# Patient Record
Sex: Female | Born: 1955 | Race: White | Hispanic: No | Smoking: Current every day smoker
Health system: Southern US, Community
[De-identification: ages and names within clinical notes are randomized; demographics above are authoritative.]

## PROBLEM LIST (undated history)

## (undated) DIAGNOSIS — K219 Gastro-esophageal reflux disease without esophagitis: Secondary | ICD-10-CM

## (undated) DIAGNOSIS — I10 Essential (primary) hypertension: Secondary | ICD-10-CM

## (undated) DIAGNOSIS — R112 Nausea with vomiting, unspecified: Secondary | ICD-10-CM

## (undated) DIAGNOSIS — Z9889 Other specified postprocedural states: Secondary | ICD-10-CM

## (undated) DIAGNOSIS — M1990 Unspecified osteoarthritis, unspecified site: Secondary | ICD-10-CM

## (undated) DIAGNOSIS — R51 Headache: Secondary | ICD-10-CM

## (undated) HISTORY — PX: CARPAL TUNNEL RELEASE: SHX101

## (undated) HISTORY — PX: TUBAL LIGATION: SHX77

## (undated) HISTORY — DX: Essential (primary) hypertension: I10

---

## 2000-12-06 ENCOUNTER — Ambulatory Visit (HOSPITAL_COMMUNITY): Admission: RE | Admit: 2000-12-06 | Discharge: 2000-12-06 | Payer: Self-pay | Admitting: Family Medicine

## 2000-12-06 ENCOUNTER — Encounter: Payer: Self-pay | Admitting: Family Medicine

## 2001-07-02 ENCOUNTER — Other Ambulatory Visit: Admission: RE | Admit: 2001-07-02 | Discharge: 2001-07-02 | Payer: Self-pay | Admitting: Family Medicine

## 2001-07-09 ENCOUNTER — Encounter: Payer: Self-pay | Admitting: Family Medicine

## 2001-07-09 ENCOUNTER — Ambulatory Visit (HOSPITAL_COMMUNITY): Admission: RE | Admit: 2001-07-09 | Discharge: 2001-07-09 | Payer: Self-pay | Admitting: Family Medicine

## 2002-10-22 ENCOUNTER — Other Ambulatory Visit: Admission: RE | Admit: 2002-10-22 | Discharge: 2002-10-22 | Payer: Self-pay | Admitting: *Deleted

## 2002-10-29 ENCOUNTER — Ambulatory Visit (HOSPITAL_COMMUNITY): Admission: RE | Admit: 2002-10-29 | Discharge: 2002-10-29 | Payer: Self-pay | Admitting: Internal Medicine

## 2002-10-29 ENCOUNTER — Encounter: Payer: Self-pay | Admitting: Internal Medicine

## 2005-05-31 ENCOUNTER — Ambulatory Visit (HOSPITAL_COMMUNITY): Admission: RE | Admit: 2005-05-31 | Discharge: 2005-05-31 | Payer: Self-pay | Admitting: Family Medicine

## 2006-02-27 IMAGING — CR DG CHEST 2V
2 series · 2 of 2 positions shown · non-contrast
Comparison: none

CLINICAL DATA: Smoking history.  Assess for active lung disease. 
 CHEST - 2 VIEW:

[view not recorded (1 of 2)]
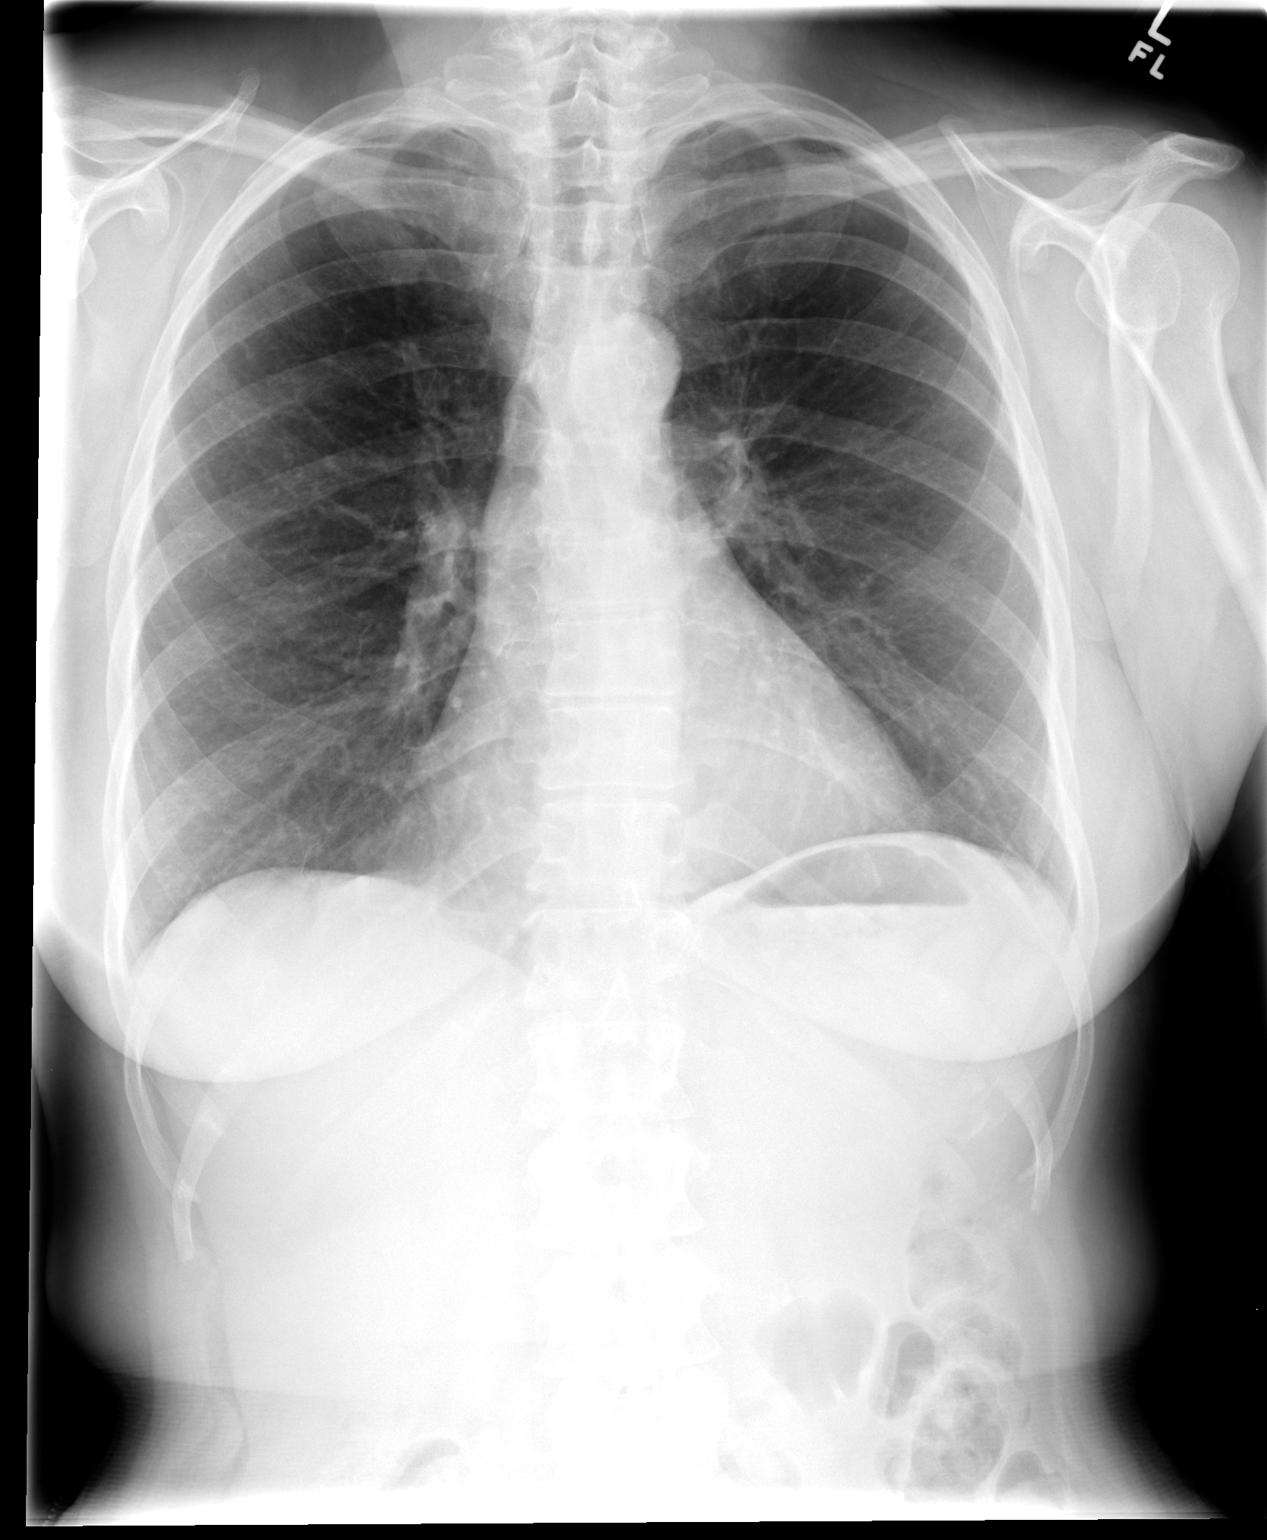

[view not recorded (2 of 2)]
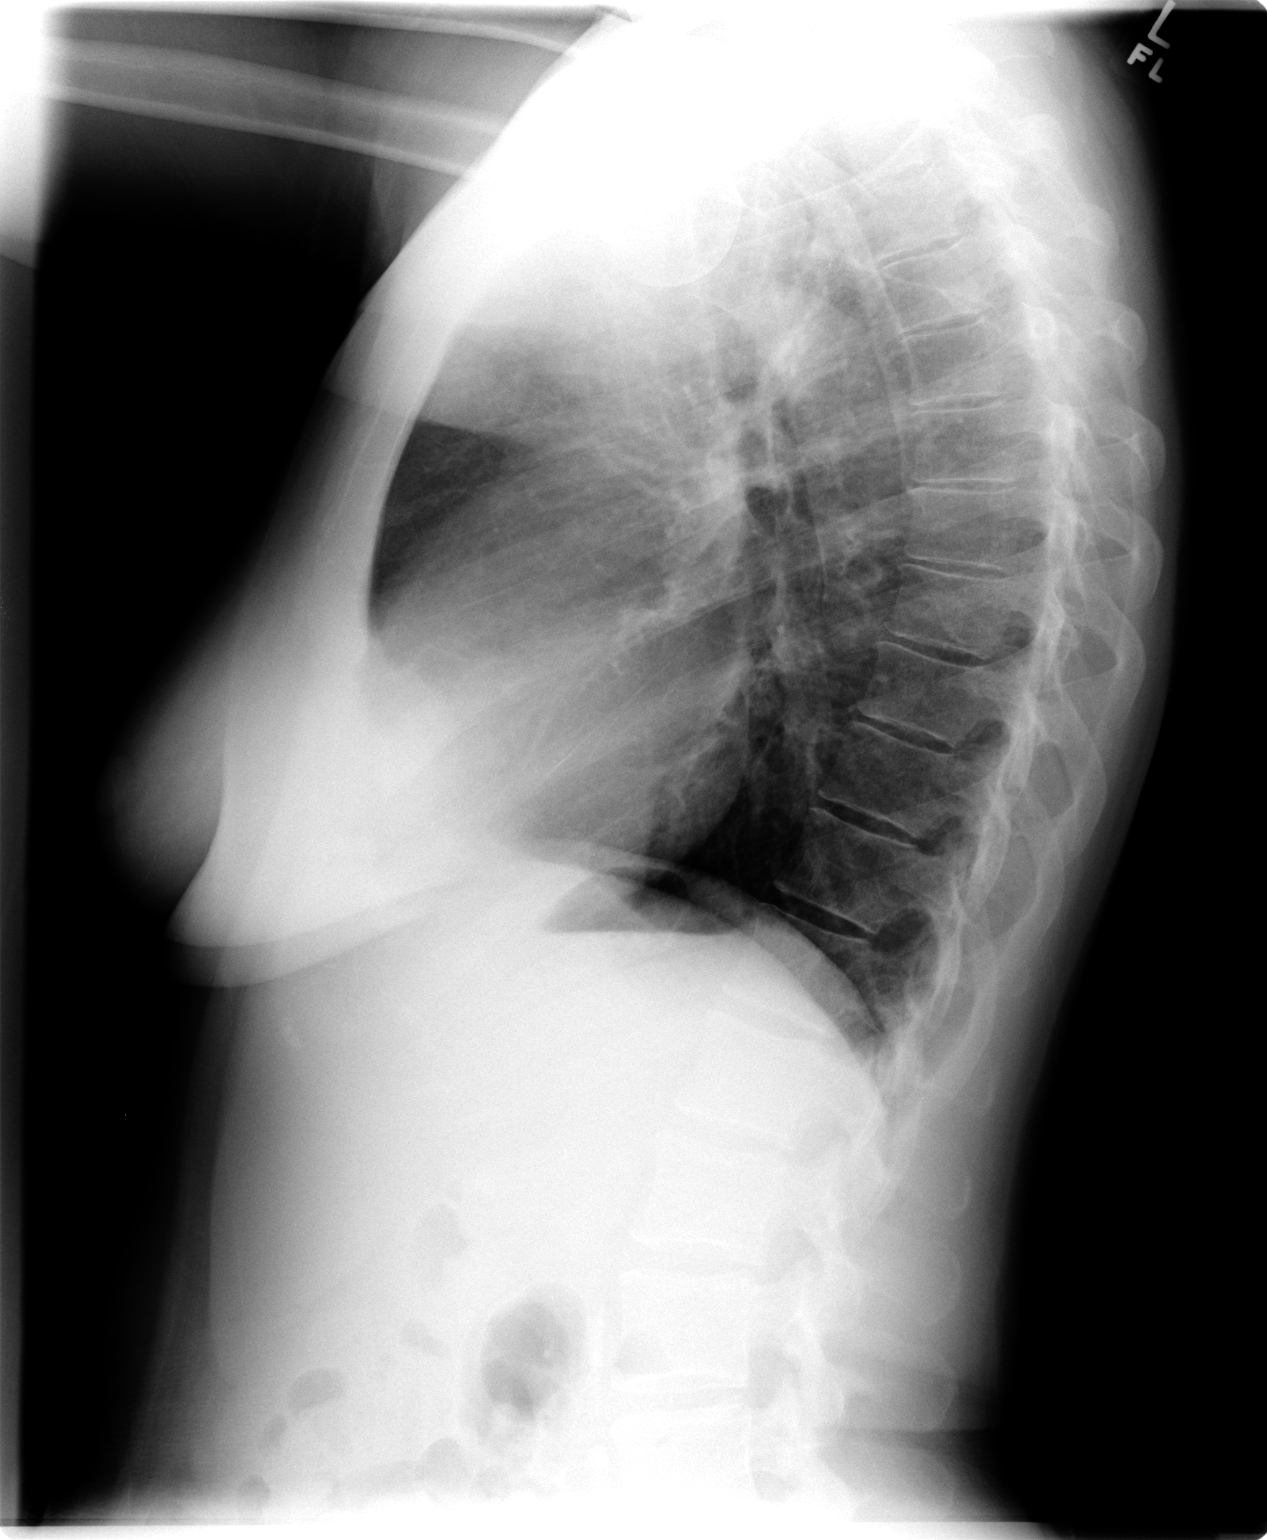

[2 of 2 positions shown; findings below may reference images not displayed]

FINDINGS: Heart size is normal.  Mediastinum is unremarkable except for mild scoliosis.  The lungs are clear.  No infiltrate, mass, effusion, or collapse.  No significant bony finding.
IMPRESSION: No active disease.

## 2008-09-21 ENCOUNTER — Emergency Department (HOSPITAL_COMMUNITY): Admission: EM | Admit: 2008-09-21 | Discharge: 2008-09-21 | Payer: Self-pay | Admitting: Emergency Medicine

## 2014-04-01 ENCOUNTER — Telehealth: Payer: Self-pay

## 2014-04-01 NOTE — Telephone Encounter (Signed)
Gastroenterology Pre-Procedure Review  Request Date: Requesting Physician: Laurance Flatten  PATIENT REVIEW QUESTIONS: The patient responded to the following health history questions as indicated:    1. Diabetes Melitis: NO 2. Joint replacements in the past 12 months: NO 3. Major health problems in the past 3 months: NO 4. Has an artificial valve or MVP: NO 5. Has a defibrillator: NO 6. Has been advised in past to take antibiotics in advance of a procedure like teeth cleaning: NO 7. Acholic:NO 8. Family History:NO     MEDICATIONS & ALLERGIES:    Patient reports the following regarding taking any blood thinners:   Plavix? NO Aspirin? NO Coumadin? NO  Patient confirms/reports the following medications:  No current outpatient prescriptions on file.   No current facility-administered medications for this visit.    Patient confirms/reports the following allergies:  Allergies not on file  No orders of the defined types were placed in this encounter.    AUTHORIZATION INFORMATION Primary Insurance: [place]  ,  [place] #: [id number] ,  Group #: [id number] Pre-Cert / Josem Kaufmann required:  Pre-Cert / Auth #:    SCHEDULE INFORMATION: Procedure has been scheduled as follows:  Date: , Time: Location:   This Gastroenterology Pre-Precedure Review Form is being routed to the following provider(s):   Would like RMR

## 2014-04-03 ENCOUNTER — Other Ambulatory Visit: Payer: Self-pay

## 2014-04-03 DIAGNOSIS — Z1211 Encounter for screening for malignant neoplasm of colon: Secondary | ICD-10-CM

## 2014-04-03 NOTE — Telephone Encounter (Signed)
PT is scheduled for colonoscopy with Dr. Gala Romney on 05/04/2014 at 7:30 Am

## 2014-04-06 NOTE — Telephone Encounter (Signed)
OK to schedule

## 2014-04-08 MED ORDER — PEG-KCL-NACL-NASULF-NA ASC-C 100 G PO SOLR: 1.0000 | ORAL | Status: DC

## 2014-04-08 NOTE — Telephone Encounter (Signed)
Rx sent to the pharmacy and instructions mailed to pt.  

## 2014-05-01 ENCOUNTER — Encounter (HOSPITAL_COMMUNITY): Payer: Self-pay | Admitting: Pharmacy Technician

## 2014-05-04 ENCOUNTER — Ambulatory Visit (HOSPITAL_COMMUNITY)
Admission: RE | Admit: 2014-05-04 | Discharge: 2014-05-04 | Disposition: A | Discharge status: Home / Self Care | Payer: BC Managed Care – PPO | Source: Ambulatory Visit | Attending: Internal Medicine | Admitting: Internal Medicine

## 2014-05-04 ENCOUNTER — Encounter (HOSPITAL_COMMUNITY)
Admission: RE | Disposition: A | Discharge status: Home / Self Care | Payer: Self-pay | Source: Ambulatory Visit | Attending: Internal Medicine

## 2014-05-04 ENCOUNTER — Encounter (HOSPITAL_COMMUNITY): Payer: Self-pay | Admitting: *Deleted

## 2014-05-04 DIAGNOSIS — K5730 Diverticulosis of large intestine without perforation or abscess without bleeding: Secondary | ICD-10-CM | POA: Diagnosis not present

## 2014-05-04 DIAGNOSIS — K648 Other hemorrhoids: Secondary | ICD-10-CM | POA: Diagnosis not present

## 2014-05-04 DIAGNOSIS — Z1211 Encounter for screening for malignant neoplasm of colon: Secondary | ICD-10-CM | POA: Diagnosis present

## 2014-05-04 DIAGNOSIS — Z86010 Personal history of colonic polyps: Secondary | ICD-10-CM

## 2014-05-04 DIAGNOSIS — D126 Benign neoplasm of colon, unspecified: Secondary | ICD-10-CM | POA: Diagnosis not present

## 2014-05-04 HISTORY — DX: Unspecified osteoarthritis, unspecified site: M19.90

## 2014-05-04 HISTORY — DX: Gastro-esophageal reflux disease without esophagitis: K21.9

## 2014-05-04 HISTORY — DX: Headache: R51

## 2014-05-04 HISTORY — PX: COLONOSCOPY: SHX5424

## 2014-05-04 SURGERY — COLONOSCOPY
Anesthesia: Moderate Sedation

## 2014-05-04 MED ORDER — ONDANSETRON HCL 4 MG/2ML IJ SOLN
INTRAMUSCULAR | Status: DC | PRN
  Administered 2014-05-04: 4 mg via INTRAVENOUS

## 2014-05-04 MED ORDER — MIDAZOLAM HCL 5 MG/5ML IJ SOLN
INTRAMUSCULAR | Status: DC | PRN
Start: 2014-05-04 — End: 2014-05-04
  Administered 2014-05-04: 1 mg via INTRAVENOUS
  Administered 2014-05-04: 2 mg via INTRAVENOUS
  Administered 2014-05-04 (×2): 1 mg via INTRAVENOUS

## 2014-05-04 MED ORDER — MEPERIDINE HCL 100 MG/ML IJ SOLN
INTRAMUSCULAR | Status: DC | PRN
  Administered 2014-05-04 (×2): 25 mg via INTRAVENOUS

## 2014-05-04 MED ORDER — SODIUM CHLORIDE 0.9 % IV SOLN
INTRAVENOUS | Status: DC
  Administered 2014-05-04: 07:00:00 via INTRAVENOUS

## 2014-05-04 MED ORDER — SIMETHICONE 40 MG/0.6ML PO SUSP
ORAL | Status: DC | PRN
  Administered 2014-05-04: 08:00:00

## 2014-05-04 MED ORDER — ONDANSETRON HCL 4 MG/2ML IJ SOLN
INTRAMUSCULAR | Status: AC
  Filled 2014-05-04: qty 2

## 2014-05-04 MED ORDER — MIDAZOLAM HCL 5 MG/5ML IJ SOLN
INTRAMUSCULAR | Status: AC
  Filled 2014-05-04: qty 10

## 2014-05-04 MED ORDER — SIMETHICONE 40 MG/0.6ML PO SUSP
ORAL | Status: AC
  Filled 2014-05-04: qty 0.6

## 2014-05-04 MED ORDER — MEPERIDINE HCL 100 MG/ML IJ SOLN
INTRAMUSCULAR | Status: AC
  Filled 2014-05-04: qty 2

## 2014-05-04 NOTE — H&P (Signed)
@LOGO @   Primary Care Physician:  Everrett Coombe, NP Primary Gastroenterologist:  Dr. Gala Romney  Pre-Procedure History & Physical: HPI:  Joan Collins is a 58 y.o. female is here for a screening colonoscopy. No bowel symptoms. No family history of colon cancer. No prior colonoscopy.  Past Medical History  Diagnosis Date  . Arthritis   . Headache(784.0)     Migraines  . GERD (gastroesophageal reflux disease)     History reviewed. No pertinent past surgical history.  Prior to Admission medications   Medication Sig Start Date End Date Taking? Authorizing Provider  ALOE VERA PO Take 1 tablet by mouth 2 (two) times daily.   Yes Historical Provider, MD  aspirin-acetaminophen-caffeine (EXCEDRIN MIGRAINE) [phone] MG per tablet Take 1 tablet by mouth every 6 (six) hours as needed for headache.   Yes Historical Provider, MD  calcium-vitamin D (OSCAL WITH D) 500-200 MG-UNIT per tablet Take 1 tablet by mouth 2 (two) times daily.   Yes Historical Provider, MD  omega-3 acid ethyl esters (LOVAZA) 1 G capsule Take 1 g by mouth 2 (two) times daily.   Yes Historical Provider, MD  polycarbophil (FIBERCON) 625 MG tablet Take 625 mg by mouth 2 (two) times daily.   Yes Historical Provider, MD  TURMERIC PO Take 1 tablet by mouth 2 (two) times daily.   Yes Historical Provider, MD    Allergies as of 04/03/2014  . (No Known Allergies)    Family History  Problem Relation Age of Onset  . Colon cancer Neg Hx     History   Social History  . Marital Status: Married    Spouse Name: N/A    Number of Children: N/A  . Years of Education: N/A   Occupational History  . Not on file.   Social History Main Topics  . Smoking status: Current Every Day Smoker -- 0.50 packs/day for 20 years    Types: Cigarettes  . Smokeless tobacco: Not on file  . Alcohol Use: No  . Drug Use: No  . Sexual Activity: Not on file   Other Topics Concern  . Not on file   Social History Narrative  . No  narrative on file    Review of Systems: See HPI, otherwise negative ROS  Physical Exam: BP 173/87  Pulse 69  Temp(Src) 97.8 F (36.6 C) (Oral)  Resp 26  Ht 5\' 1"  (1.549 m)  Wt 110 lb (49.896 kg)  BMI 20.80 kg/m2  SpO2 97% General:   Alert,  Well-developed, well-nourished, pleasant and cooperative in NAD Head:  Normocephalic and atraumatic. Eyes:  Sclera clear, no icterus.   Conjunctiva pink. Ears:  Normal auditory acuity. Nose:  No deformity, discharge,  or lesions. Mouth:  No deformity or lesions, dentition normal. Neck:  Supple; no masses or thyromegaly. Lungs:  Clear throughout to auscultation.   No wheezes, crackles, or rhonchi. No acute distress. Heart:  Regular rate and rhythm; no murmurs, clicks, rubs,  or gallops. Abdomen:  Soft, nontender and nondistended. No masses, hepatosplenomegaly or hernias noted. Normal bowel sounds, without guarding, and without rebound.   Msk:  Symmetrical without gross deformities. Normal posture. Pulses:  Normal pulses noted. Extremities:  Without clubbing or edema. Neurologic:  Alert and  oriented x4;  grossly normal neurologically.  Impression/Plan: [name] is now here to undergo a screening colonoscopy.  First-ever average risk screening examination. Risks, benefits, limitations, imponderables and alternatives regarding colonoscopy have been reviewed with the patient. Questions have been answered. All parties agreeable.  Notice:  This dictation was prepared with Dragon dictation along with smaller phrase technology. Any transcriptional errors that result from this process are unintentional and may not be corrected upon review.

## 2014-05-04 NOTE — Discharge Instructions (Addendum)
Colonoscopy Discharge Instructions  Read the instructions outlined below and refer to this sheet in the next few weeks. These discharge instructions provide you with general information on caring for yourself after you leave the hospital. Your doctor may also give you specific instructions. While your treatment has been planned according to the most current medical practices available, unavoidable complications occasionally occur. If you have any problems or questions after discharge, call Dr. Gala Romney at [phone]. ACTIVITY  You may resume your regular activity, but move at a slower pace for the next 24 hours.   Take frequent rest periods for the next 24 hours.   Walking will help get rid of the air and reduce the bloated feeling in your belly (abdomen).   No driving for 24 hours (because of the medicine (anesthesia) used during the test).    Do not sign any important legal documents or operate any machinery for 24 hours (because of the anesthesia used during the test).  NUTRITION  Drink plenty of fluids.   You may resume your normal diet as instructed by your doctor.   Begin with a light meal and progress to your normal diet. Heavy or fried foods are harder to digest and may make you feel sick to your stomach (nauseated).   Avoid alcoholic beverages for 24 hours or as instructed.  MEDICATIONS  You may resume your normal medications unless your doctor tells you otherwise.  WHAT YOU CAN EXPECT TODAY  Some feelings of bloating in the abdomen.   Passage of more gas than usual.   Spotting of blood in your stool or on the toilet paper.  IF YOU HAD POLYPS REMOVED DURING THE COLONOSCOPY:  No aspirin products for 7 days or as instructed.   No alcohol for 7 days or as instructed.   Eat a soft diet for the next 24 hours.  FINDING OUT THE RESULTS OF YOUR TEST Not all test results are available during your visit. If your test results are not back during the visit, make an appointment  with your caregiver to find out the results. Do not assume everything is normal if you have not heard from your caregiver or the medical facility. It is important for you to follow up on all of your test results.  SEEK IMMEDIATE MEDICAL ATTENTION IF:  You have more than a spotting of blood in your stool.   Your belly is swollen (abdominal distention).   You are nauseated or vomiting.   You have a temperature over 101.   You have abdominal pain or discomfort that is severe or gets worse throughout the day.     Polyp and diverticulosis information provided  Further recommendations to follow pending review of pathology   Diverticulosis Diverticulosis is the condition that develops when small pouches (diverticula) form in the wall of your colon. Your colon, or large intestine, is where water is absorbed and stool is formed. The pouches form when the inside layer of your colon pushes through weak spots in the outer layers of your colon. CAUSES  No one knows exactly what causes diverticulosis. RISK FACTORS  Being older than 36. Your risk for this condition increases with age. Diverticulosis is rare in people younger than 40 years. By age 62, almost everyone has it.  Eating a low-fiber diet.  Being frequently constipated.  Being overweight.  Not getting enough exercise.  Smoking.  Taking over-the-counter pain medicines, like aspirin and ibuprofen. SYMPTOMS  Most people with diverticulosis do not have symptoms. DIAGNOSIS  diverticulosis often has no symptoms, health care providers often discover the condition during an exam for other colon problems. In many cases, a health care provider will diagnose diverticulosis while using a flexible scope to examine the colon (colonoscopy). °TREATMENT  °If you have never developed an infection related to diverticulosis, you may not need treatment. If you have had an infection before, treatment may include: °· Eating more fruits,  vegetables, and grains. °· Taking a fiber supplement. °· Taking a live bacteria supplement (probiotic). °· Taking medicine to relax your colon. °HOME CARE INSTRUCTIONS  °· Drink at least 6-8 glasses of water each day to prevent constipation. °· Try not to strain when you have a bowel movement. °· Keep all follow-up appointments. °If you have had an infection before:  °· Increase the fiber in your diet as directed by your health care provider or dietitian. °· Take a dietary fiber supplement if your health care provider approves. °· Only take medicines as directed by your health care provider. °SEEK MEDICAL CARE IF:  °· You have abdominal pain. °· You have bloating. °· You have cramps. °· You have not gone to the bathroom in 3 days. °SEEK IMMEDIATE MEDICAL CARE IF:  °· Your pain gets worse. °· Your bloating becomes very bad. °· You have a fever or chills, and your symptoms suddenly get worse. °· You begin vomiting. °· You have bowel movements that are bloody or black. °MAKE SURE YOU: °· Understand these instructions. °· Will watch your condition. °· Will get help right away if you are not doing well or get worse. °Document Released: 05/04/2004 Document Revised: 08/12/2013 Document Reviewed: 07/02/2013 °ExitCare® Patient Information ©2015 [employer]. This information is not intended to replace advice given to you by your health care provider. Make sure you discuss any questions you have with your health care provider. ° ° °Colon Polyps °Polyps are lumps of extra tissue growing inside the body. Polyps can grow in the large intestine (colon). Most colon polyps are noncancerous (benign). However, some colon polyps can become cancerous over time. Polyps that are larger than a pea may be harmful. To be safe, caregivers remove and test all polyps. °CAUSES  °Polyps form when mutations in the genes cause your cells to grow and divide even though no more tissue is needed. °RISK FACTORS °There are a number of risk factors  that can increase your chances of getting colon polyps. They include: °· Being older than 50 years. °· Family history of colon polyps or colon cancer. °· Long-term colon diseases, such as colitis or Crohn disease. °· Being overweight. °· Smoking. °· Being inactive. °· Drinking too much alcohol. °SYMPTOMS  °Most small polyps do not cause symptoms. If symptoms are present, they may include: °· Blood in the stool. The stool may look dark red or black. °· Constipation or diarrhea that lasts longer than 1 week. °DIAGNOSIS °People often do not know they have polyps until their caregiver finds them during a regular checkup. Your caregiver can use 4 tests to check for polyps: °· Digital rectal exam. The caregiver wears gloves and feels inside the rectum. This test would find polyps only in the rectum. °· Barium enema. The caregiver puts a liquid called barium into your rectum before taking X-rays of your colon. Barium makes your colon look white. Polyps are dark, so they are easy to see in the X-ray pictures. °· Sigmoidoscopy. A thin, flexible tube (sigmoidoscope) is placed into your rectum. The sigmoidoscope has a light and tiny camera   in it. The caregiver uses the sigmoidoscope to look at the last third of your colon. °· Colonoscopy. This test is like sigmoidoscopy, but the caregiver looks at the entire colon. This is the most common method for finding and removing polyps. °TREATMENT  °Any polyps will be removed during a sigmoidoscopy or colonoscopy. The polyps are then tested for cancer. °PREVENTION  °To help lower your risk of getting more colon polyps: °· Eat plenty of fruits and vegetables. Avoid eating fatty foods. °· Do not smoke. °· Avoid drinking alcohol. °· Exercise every day. °· Lose weight if recommended by your caregiver. °· Eat plenty of calcium and folate. Foods that are rich in calcium include milk, cheese, and broccoli. Foods that are rich in folate include chickpeas, kidney beans, and spinach. °HOME CARE  INSTRUCTIONS °Keep all follow-up appointments as directed by your caregiver. You may need periodic exams to check for polyps. °SEEK MEDICAL CARE IF: °You notice bleeding during a bowel movement. °Document Released: 05/03/2004 Document Revised: 10/30/2011 Document Reviewed: 10/17/2011 °ExitCare® Patient Information ©2015 [employer]. This information is not intended to replace advice given to you by your health care provider. Make sure you discuss any questions you have with your health care provider. ° °

## 2014-05-04 NOTE — Op Note (Signed)
[facility] [address] [place], [address]   COLONOSCOPY PROCEDURE REPORT  PATIENT: Joan Collins, Joan Collins  MR#:         [id number] BIRTHDATE: 1956/06/04 , 57  yrs. old GENDER: Female ENDOSCOPIST: R.  Garfield Cornea, MD FACP [facility] REFERRED BY:  Redge Gainer, M.D. PROCEDURE DATE:  05/04/2014 PROCEDURE:     Colonoscopy with biopsy  INDICATIONS: First-ever average risk colorectal cancer screening examination  INFORMED CONSENT:  The risks, benefits, alternatives and imponderables including but not limited to bleeding, perforation as well as the possibility of a missed lesion have been reviewed.  The potential for biopsy, lesion removal, etc. have also been discussed.  Questions have been answered.  All parties agreeable. Please see the history and physical in the medical record for more information.  MEDICATIONS: Versed 5 mg IV and Demerol 50 mg IV in divided doses. Zofran 4 mg IV.  DESCRIPTION OF PROCEDURE:  After a digital rectal exam was performed, the EC-3890Li ([id number]) and EC-3490TLi ([id number]) colonoscope was advanced from the anus through the rectum and colon to the area of the cecum, ileocecal valve and appendiceal orifice. The cecum was deeply intubated.  These structures were well-seen and photographed for the record.  From the level of the cecum and ileocecal valve, the scope was slowly and cautiously withdrawn. The mucosal surfaces were carefully surveyed utilizing scope tip deflection to facilitate fold flattening as needed.  The scope was pulled down into the rectum where a thorough examination including retroflexion was performed.    FINDINGS:  Adequate preparation. Anal papilla and internal hemorrhoids; otherwise, normal rectum. Scattered pancolonic diverticula; (1) 3 mm polyp at the splenic flexure; otherwise, the remainder of colonic mucosa appeared normal.  THERAPEUTIC / DIAGNOSTIC MANEUVERS PERFORMED:  The above-mentioned polyps cold  biopsied/removed.  COMPLICATIONS: none  CECAL WITHDRAWAL TIME:  14 minutes  IMPRESSION:  Pancolonic diverticulosis. Single colonic polyp-removed as described above.  RECOMMENDATIONS: Followup on pathology.   _______________________________ eSigned:  R. Garfield Cornea, MD FACP [facility] 05/04/2014 8:32 AM   CC:

## 2014-05-06 ENCOUNTER — Encounter (HOSPITAL_COMMUNITY): Payer: Self-pay | Admitting: Internal Medicine

## 2014-05-06 ENCOUNTER — Encounter: Payer: Self-pay | Admitting: Internal Medicine

## 2014-08-15 ENCOUNTER — Encounter (HOSPITAL_COMMUNITY): Payer: Self-pay | Admitting: Emergency Medicine

## 2014-08-15 ENCOUNTER — Emergency Department (HOSPITAL_COMMUNITY): Payer: BC Managed Care – PPO

## 2014-08-15 ENCOUNTER — Emergency Department (HOSPITAL_COMMUNITY)
Admission: EM | Admit: 2014-08-15 | Discharge: 2014-08-15 | Disposition: A | Discharge status: Home / Self Care | Payer: BC Managed Care – PPO | Attending: Emergency Medicine | Admitting: Emergency Medicine

## 2014-08-15 DIAGNOSIS — R51 Headache: Secondary | ICD-10-CM

## 2014-08-15 DIAGNOSIS — Z8739 Personal history of other diseases of the musculoskeletal system and connective tissue: Secondary | ICD-10-CM | POA: Insufficient documentation

## 2014-08-15 DIAGNOSIS — I10 Essential (primary) hypertension: Secondary | ICD-10-CM | POA: Diagnosis not present

## 2014-08-15 DIAGNOSIS — Z8719 Personal history of other diseases of the digestive system: Secondary | ICD-10-CM | POA: Insufficient documentation

## 2014-08-15 DIAGNOSIS — G43909 Migraine, unspecified, not intractable, without status migrainosus: Secondary | ICD-10-CM | POA: Diagnosis not present

## 2014-08-15 DIAGNOSIS — Z79899 Other long term (current) drug therapy: Secondary | ICD-10-CM | POA: Diagnosis not present

## 2014-08-15 DIAGNOSIS — R519 Headache, unspecified: Secondary | ICD-10-CM

## 2014-08-15 DIAGNOSIS — Z720 Tobacco use: Secondary | ICD-10-CM | POA: Diagnosis not present

## 2014-08-15 LAB — CBC WITH DIFFERENTIAL/PLATELET
BASOS ABS: 0 10*3/uL (ref 0.0–0.1)
Basophils Relative: 0 % (ref 0–1)
Eosinophils Absolute: 0 10*3/uL (ref 0.0–0.7)
Eosinophils Relative: 0 % (ref 0–5)
HCT: 42.1 % (ref 36.0–46.0)
HEMOGLOBIN: 13.7 g/dL (ref 12.0–15.0)
LYMPHS PCT: 15 % (ref 12–46)
Lymphs Abs: 0.9 10*3/uL (ref 0.7–4.0)
MCH: 29.5 pg (ref 26.0–34.0)
MCHC: 32.5 g/dL (ref 30.0–36.0)
MCV: 90.5 fL (ref 78.0–100.0)
MONO ABS: 0.3 10*3/uL (ref 0.1–1.0)
Monocytes Relative: 5 % (ref 3–12)
NEUTROS ABS: 4.9 10*3/uL (ref 1.7–7.7)
Neutrophils Relative %: 80 % — ABNORMAL HIGH (ref 43–77)
Platelets: 228 10*3/uL (ref 150–400)
RBC: 4.65 MIL/uL (ref 3.87–5.11)
RDW: 13.3 % (ref 11.5–15.5)
WBC: 6.1 10*3/uL (ref 4.0–10.5)

## 2014-08-15 LAB — SEDIMENTATION RATE: Sed Rate: 3 mm/hr (ref 0–22)

## 2014-08-15 LAB — COMPREHENSIVE METABOLIC PANEL
ALT: 12 U/L (ref 0–35)
AST: 15 U/L (ref 0–37)
Albumin: 4.2 g/dL (ref 3.5–5.2)
Alkaline Phosphatase: 59 U/L (ref 39–117)
Anion gap: 6 (ref 5–15)
BILIRUBIN TOTAL: 0.5 mg/dL (ref 0.3–1.2)
BUN: 10 mg/dL (ref 6–23)
CHLORIDE: 104 meq/L (ref 96–112)
CO2: 29 mmol/L (ref 19–32)
CREATININE: 0.6 mg/dL (ref 0.50–1.10)
Calcium: 9.4 mg/dL (ref 8.4–10.5)
GFR calc Af Amer: >90 mL/min (ref >90)
GLUCOSE: 106 mg/dL — AB (ref 70–99)
Potassium: 4 mmol/L (ref 3.5–5.1)
Sodium: 139 mmol/L (ref 135–145)
Total Protein: 6.9 g/dL (ref 6.0–8.3)

## 2014-08-15 MED ORDER — HYDROCHLOROTHIAZIDE 25 MG PO TABS: 25.0000 mg | ORAL_TABLET | Freq: Every day | ORAL | Status: DC

## 2014-08-15 MED ORDER — DIPHENHYDRAMINE HCL 50 MG/ML IJ SOLN
25.0000 mg | Freq: Once | INTRAMUSCULAR | Status: AC
  Administered 2014-08-15: 25 mg via INTRAVENOUS
  Filled 2014-08-15: qty 1

## 2014-08-15 MED ORDER — METOCLOPRAMIDE HCL 5 MG/ML IJ SOLN
10.0000 mg | Freq: Once | INTRAMUSCULAR | Status: AC
  Administered 2014-08-15: 10 mg via INTRAVENOUS
  Filled 2014-08-15: qty 2

## 2014-08-15 MED ORDER — KETOROLAC TROMETHAMINE 30 MG/ML IJ SOLN
30.0000 mg | Freq: Once | INTRAMUSCULAR | Status: AC
  Administered 2014-08-15: 30 mg via INTRAVENOUS
  Filled 2014-08-15: qty 1

## 2014-08-15 NOTE — ED Notes (Signed)
Patient complaining of headache and high blood pressure x 3 days. Also complaining of vomiting that started this morning.

## 2014-08-15 NOTE — ED Provider Notes (Signed)
CSN: [id number]     Arrival date & time 08/15/14  1220 History  This chart was scribed for Ezequiel Essex, MD by Stephania Fragmin, ED Scribe. This patient was seen in room [facility]08/[facility]08 and the patient's care was started at 3:59 PM.    Chief Complaint  Patient presents with  . Headache  . Hypertension   The history is provided by the patient. No language interpreter was used.   HPI Comments: Joan Collins is a 58 y.o. female who presents to the Emergency Department complaining of a gradual onset, constant, worsening, generalized headache that began 3 days ago. Patient complains of associated vomiting, blurry vision, numbness in her hands, and weakness in her legs. Coffee, Sprite, light, and noise all exacerbate her pain, although she states that this does not feel like a migraine. Patient has tried Excedrin Migraine with no relief. She has been off BP medication for about 2 months. She complains of associated hypertension; her BP last night was 173/110. Patient has a history GERD and arthritis. She denies taking prescription medications. Patient denies regular use of aspirin or blood thinners. She denies fever. Patient has NKDA. Everrett Coombe, NP is her PCP.   Past Medical History  Diagnosis Date  . Arthritis   . Headache(784.0)     Migraines  . GERD (gastroesophageal reflux disease)    Past Surgical History  Procedure Laterality Date  . Colonoscopy N/A 05/04/2014    Procedure: COLONOSCOPY;  Surgeon: Daneil Dolin, MD;  Location: [facility];  Service: Endoscopy;  Laterality: N/A;  7:30 AM  . Tubal ligation     Family History  Problem Relation Age of Onset  . Colon cancer Neg Hx    History  Substance Use Topics  . Smoking status: Current Every Day Smoker -- 0.50 packs/day for 20 years    Types: Cigarettes  . Smokeless tobacco: Not on file  . Alcohol Use: No   OB History    No data available     Review of Systems  A complete 10 system review of systems was obtained and  all systems are negative except as noted in the HPI and PMH.    Allergies  Review of patient's allergies indicates no known allergies.  Home Medications   Prior to Admission medications   Medication Sig Start Date End Date Taking? Authorizing Provider  ALOE VERA PO Take 1 tablet by mouth 2 (two) times daily.   Yes Historical Provider, MD  aspirin-acetaminophen-caffeine (EXCEDRIN MIGRAINE) [phone] MG per tablet Take 1 tablet by mouth every 6 (six) hours as needed for headache.   Yes Historical Provider, MD  calcium-vitamin D (OSCAL WITH D) 500-200 MG-UNIT per tablet Take 1 tablet by mouth 2 (two) times daily.   Yes Historical Provider, MD  polycarbophil (FIBERCON) 625 MG tablet Take 625 mg by mouth 2 (two) times daily.   Yes Historical Provider, MD  TURMERIC PO Take 1 tablet by mouth 2 (two) times daily.   Yes Historical Provider, MD  hydrochlorothiazide (HYDRODIURIL) 25 MG tablet Take 1 tablet (25 mg total) by mouth daily. 08/15/14   Ezequiel Essex, MD  omega-3 acid ethyl esters (LOVAZA) 1 G capsule Take 1 g by mouth 2 (two) times daily.    Historical Provider, MD   BP 165/71 mmHg  Pulse 72  Temp(Src) 98 F (36.7 C) (Oral)  Resp 19  Ht 5\' 1"  (1.549 m)  Wt 113 lb (51.256 kg)  BMI 21.36 kg/m2  SpO2 95% Physical Exam  Constitutional:  She is oriented to person, place, and time. She appears well-developed and well-nourished. No distress.  HENT:  Head: Normocephalic and atraumatic.  Mouth/Throat: Oropharynx is clear and moist. No oropharyngeal exudate.  Frontal sinus tenderness. No temporal artery tenderness.  Eyes: Conjunctivae and EOM are normal. Pupils are equal, round, and reactive to light.  Neck: Normal range of motion. Neck supple.  No meningismus.  Cardiovascular: Normal rate, regular rhythm, normal heart sounds and intact distal pulses.   No murmur heard. Pulmonary/Chest: Effort normal and breath sounds normal. No respiratory distress.  Abdominal: Soft. There is no  tenderness. There is no rebound and no guarding.  Musculoskeletal: Normal range of motion. She exhibits no edema or tenderness.  Neurological: She is alert and oriented to person, place, and time. No cranial nerve deficit. She exhibits normal muscle tone. Coordination normal.  No ataxia on finger to nose bilaterally. No pronator drift. 5/5 strength throughout. CN 2-12 intact. Negative Romberg. Equal grip strength. Sensation intact. Gait is normal.   Skin: Skin is warm.  Psychiatric: She has a normal mood and affect. Her behavior is normal.  Nursing note and vitals reviewed.   ED Course  Procedures (including critical care time)  DIAGNOSTIC STUDIES: Oxygen Saturation is 95% on room air, adequate by my interpretation.    COORDINATION OF CARE: 4:08 PM - Discussed treatment plan with pt at bedside which includes pain medication and tests and pt agreed to plan.  6:51 PM - Patient states that the headache has almost dissipated. Discussed plans to discharge.  Labs Review Labs Reviewed  CBC WITH DIFFERENTIAL - Abnormal; Notable for the following:    Neutrophils Relative % 80 (*)    All other components within normal limits  COMPREHENSIVE METABOLIC PANEL - Abnormal; Notable for the following:    Glucose, Bld 106 (*)    All other components within normal limits  SEDIMENTATION RATE    Imaging Review Ct Head Wo Contrast  08/15/2014   CLINICAL DATA:  Headache for weeks.  EXAM: CT HEAD WITHOUT CONTRAST  TECHNIQUE: Contiguous axial images were obtained from the base of the skull through the vertex without intravenous contrast.  COMPARISON:  None.  FINDINGS: No acute intracranial abnormality. Specifically, no hemorrhage, hydrocephalus, mass lesion, acute infarction, or significant intracranial injury. No acute calvarial abnormality. Visualized paranasal sinuses and mastoids clear. Orbital soft tissues unremarkable.  IMPRESSION: Negative.   Electronically Signed   By: Rolm Baptise M.D.   On:  08/15/2014 17:39     EKG Interpretation None      MDM   Final diagnoses:  Headache  Essential hypertension   history of gradual onset diffuse headache. No focal weakness, numbness or tingling. 2 episodes of vomiting. No longer takes medicine for high blood pressure.  No thunderclap onset. Low suspicion for SAH, meningitis, temporal arteritis.  Nonfocal neurological exam. CT head negative. Blood pressure 155/79  Headache resolved with toradol, reglan, benadryl in the ED.  Advised patient to keep BP log. WIll start low dose HCTZ.  Follow up with PCP. Return precautions discussed.  BP 165/71 mmHg  Pulse 72  Temp(Src) 98 F (36.7 C) (Oral)  Resp 19  Ht 5\' 1"  (1.549 m)  Wt 113 lb (51.256 kg)  BMI 21.36 kg/m2  SpO2 95%   I personally performed the services described in this documentation, which was scribed in my presence. The recorded information has been reviewed and is accurate.    Ezequiel Essex, MD 08/16/14 [phone]

## 2014-08-15 NOTE — Discharge Instructions (Signed)
Hypertension Follow-up with her doctor regarding her elevated blood pressure. Take the medication as prescribed. Return to the ED if you develop new or worsening symptoms. Hypertension, commonly called high blood pressure, is when the force of blood pumping through your arteries is too strong. Your arteries are the blood vessels that carry blood from your heart throughout your body. A blood pressure reading consists of a higher number over a lower number, such as 110/72. The higher number (systolic) is the pressure inside your arteries when your heart pumps. The lower number (diastolic) is the pressure inside your arteries when your heart relaxes. Ideally you want your blood pressure below 120/80. Hypertension forces your heart to work harder to pump blood. Your arteries may become narrow or stiff. Having hypertension puts you at risk for heart disease, stroke, and other problems.  RISK FACTORS Some risk factors for high blood pressure are controllable. Others are not.  Risk factors you cannot control include:   Race. You may be at higher risk if you are African American.  Age. Risk increases with age.  Gender. Men are at higher risk than women before age 71 years. After age 77, women are at higher risk than men. Risk factors you can control include:  Not getting enough exercise or physical activity.  Being overweight.  Getting too much fat, sugar, calories, or salt in your diet.  Drinking too much alcohol. SIGNS AND SYMPTOMS Hypertension does not usually cause signs or symptoms. Extremely high blood pressure (hypertensive crisis) may cause headache, anxiety, shortness of breath, and nosebleed. DIAGNOSIS  To check if you have hypertension, your health care provider will measure your blood pressure while you are seated, with your arm held at the level of your heart. It should be measured at least twice using the same arm. Certain conditions can cause a difference in blood pressure between  your right and left arms. A blood pressure reading that is higher than normal on one occasion does not mean that you need treatment. If one blood pressure reading is high, ask your health care provider about having it checked again. TREATMENT  Treating high blood pressure includes making lifestyle changes and possibly taking medicine. Living a healthy lifestyle can help lower high blood pressure. You may need to change some of your habits. Lifestyle changes may include:  Following the DASH diet. This diet is high in fruits, vegetables, and whole grains. It is low in salt, red meat, and added sugars.  Getting at least 2 hours of brisk physical activity every week.  Losing weight if necessary.  Not smoking.  Limiting alcoholic beverages.  Learning ways to reduce stress. If lifestyle changes are not enough to get your blood pressure under control, your health care provider may prescribe medicine. You may need to take more than one. Work closely with your health care provider to understand the risks and benefits. HOME CARE INSTRUCTIONS  Have your blood pressure rechecked as directed by your health care provider.   Take medicines only as directed by your health care provider. Follow the directions carefully. Blood pressure medicines must be taken as prescribed. The medicine does not work as well when you skip doses. Skipping doses also puts you at risk for problems.   Do not smoke.   Monitor your blood pressure at home as directed by your health care provider. SEEK MEDICAL CARE IF:   You think you are having a reaction to medicines taken.  You have recurrent headaches or feel dizzy.  You  have swelling in your ankles.  You have trouble with your vision. SEEK IMMEDIATE MEDICAL CARE IF:  You develop a severe headache or confusion.  You have unusual weakness, numbness, or feel faint.  You have severe chest or abdominal pain.  You vomit repeatedly.  You have trouble  breathing. MAKE SURE YOU:   Understand these instructions.  Will watch your condition.  Will get help right away if you are not doing well or get worse. Document Released: 08/07/2005 Document Revised: 12/22/2013 Document Reviewed: 05/30/2013 [facility] Patient Information 2015 [place], [place]. This information is not intended to replace advice given to you by your health care provider. Make sure you discuss any questions you have with your health care provider.

## 2014-08-15 NOTE — ED Notes (Signed)
Patient walked length of ED, no symptoms. VS after ambulating 95% RA, HR 60, BP 165/71 Patient drank 120cc of fluid, no symptoms.

## 2015-05-14 IMAGING — CT CT HEAD W/O CM
1 series · 16 of 30 positions shown, 20 images · non-contrast
Comparison: None.

CLINICAL DATA: Headache for weeks.

EXAM:
CT HEAD WITHOUT CONTRAST
TECHNIQUE: Contiguous axial images were obtained from the base of the skull
through the vertex without intravenous contrast.

[Series 2: headseq 4.8 h37s · axial · 0.43mm/px · z∈[+81,+236]mm · 16 of 36 slices shown, 20 images]
[im 2/36  brain]
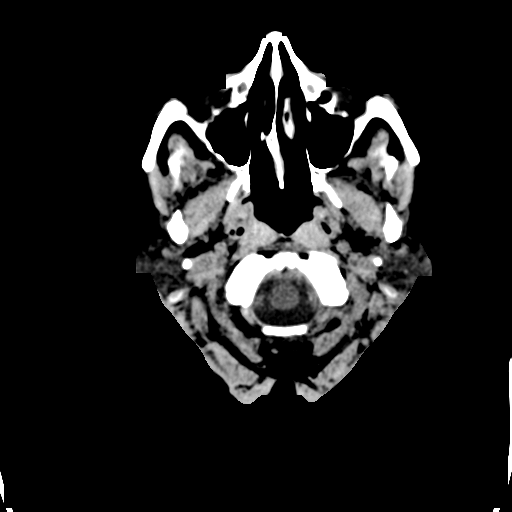
[im 2/36  bone]
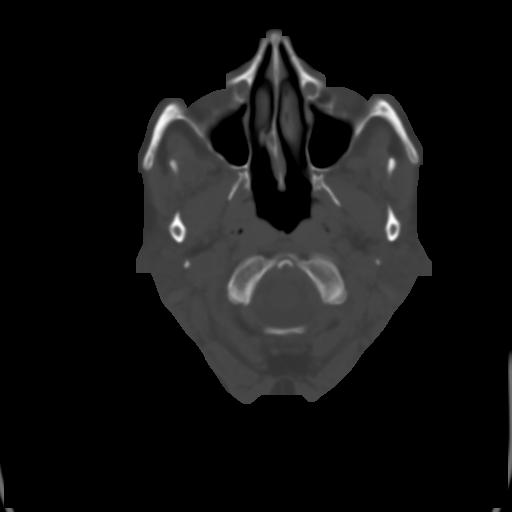
[im 4/36  brain]
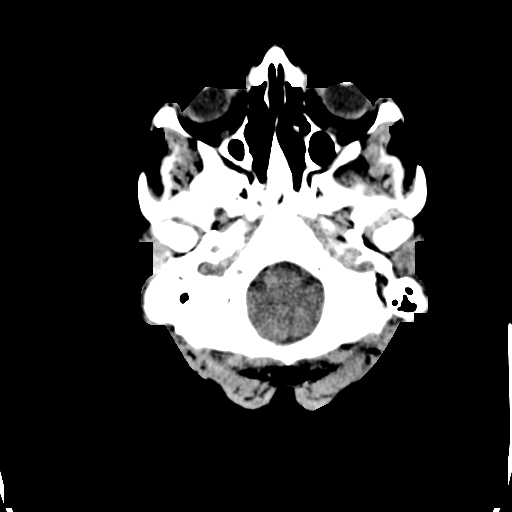
[im 7/36  brain]
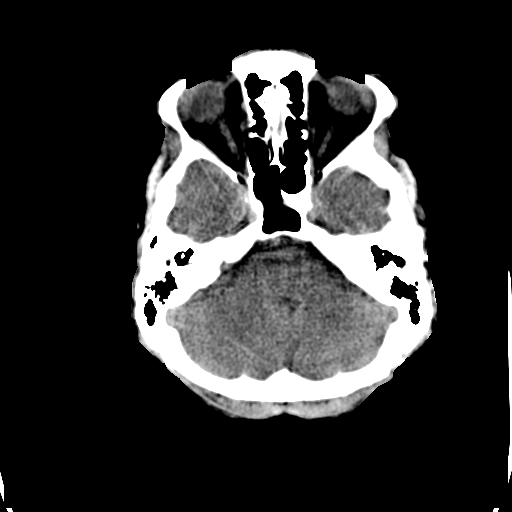
[im 9/36  brain]
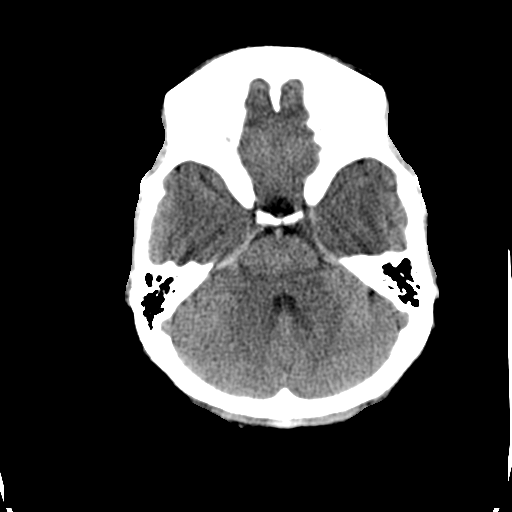
[im 10/36  brain]
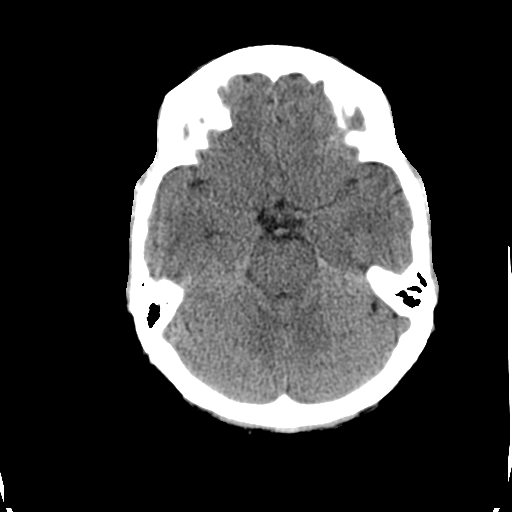
[im 10/36  bone]
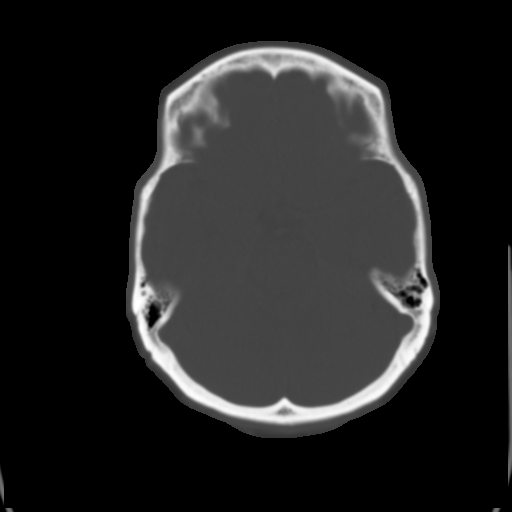
[im 13/36  brain]
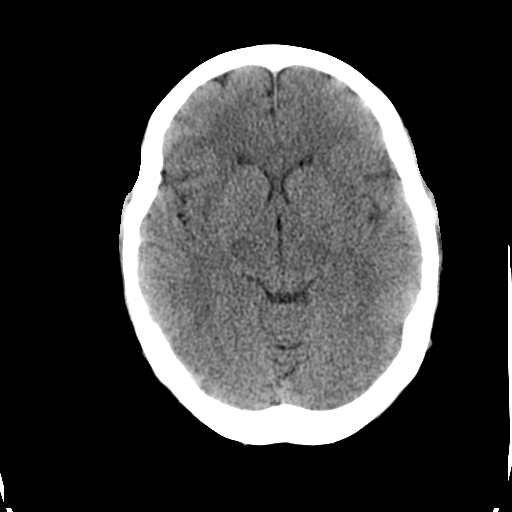
[im 15/36  brain]
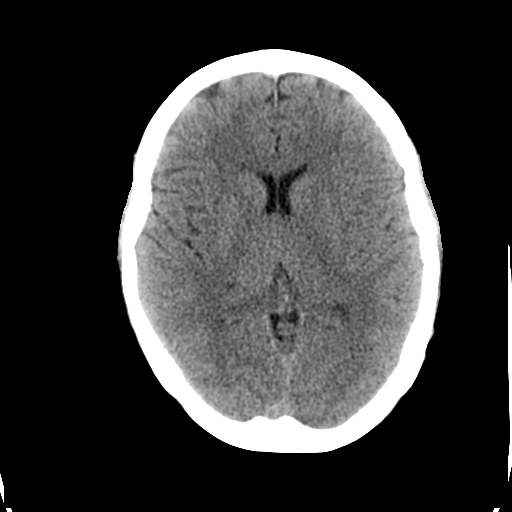
[im 17/36  brain]
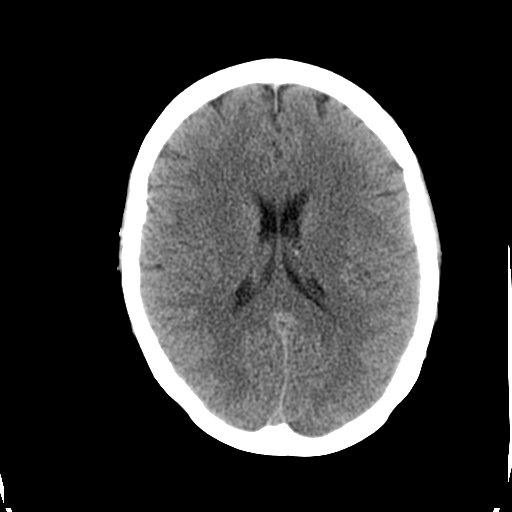
[im 19/36  brain]
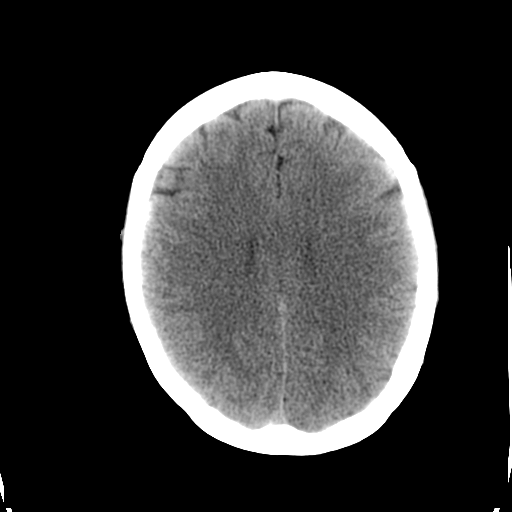
[im 19/36  bone]
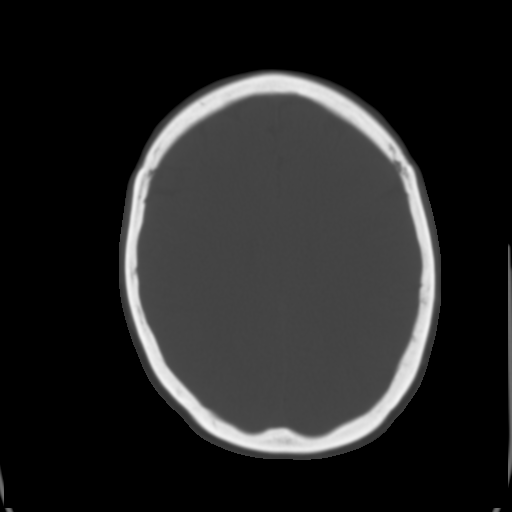
[im 21/36  brain]
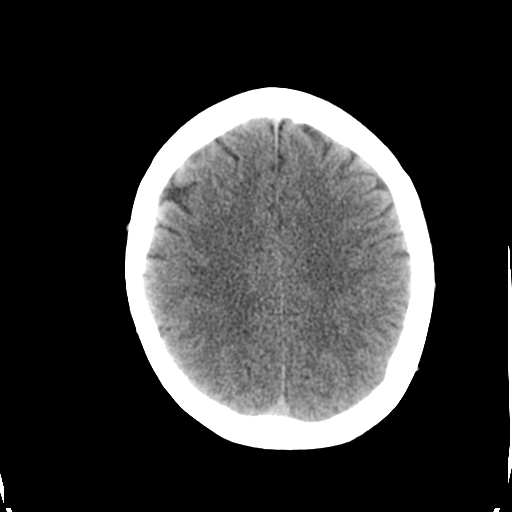
[im 23/36  brain]
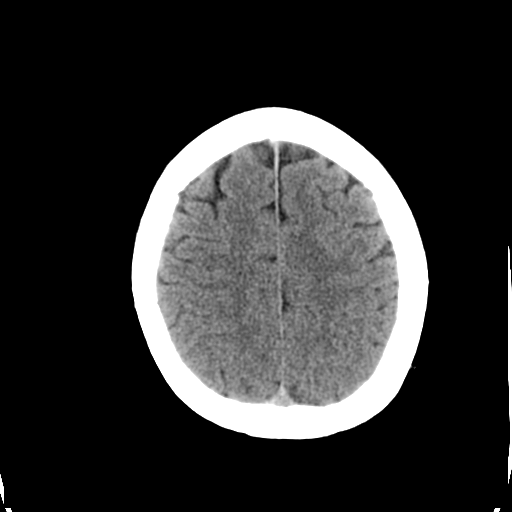
[im 26/36  brain]
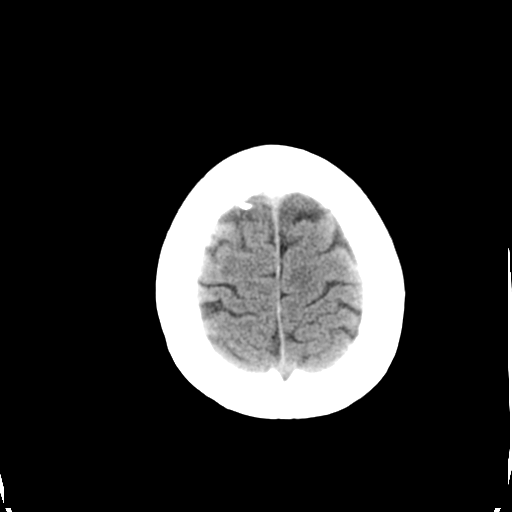
[im 27/36  brain]
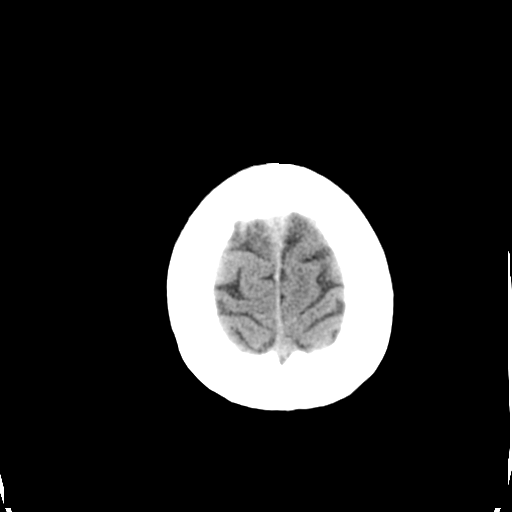
[im 27/36  bone]
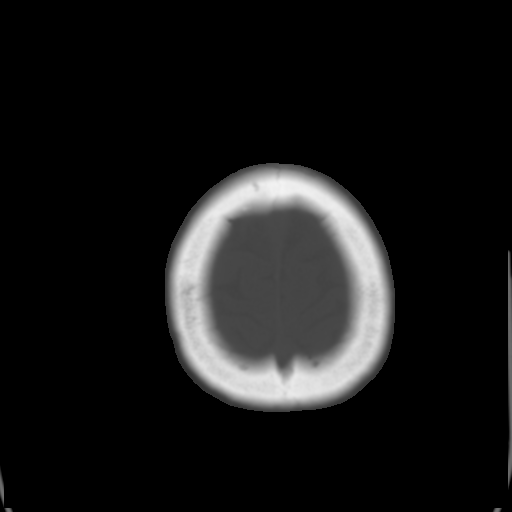
[im 29/36  brain]
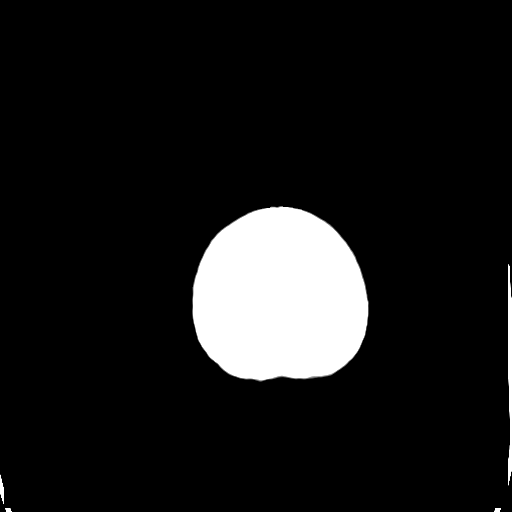
[im 32/36  brain]
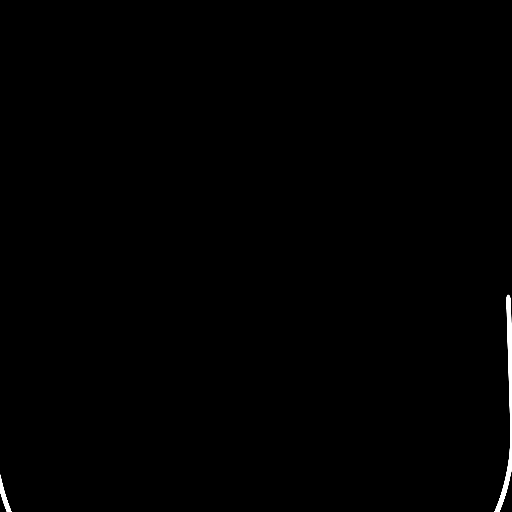
[im 34/36  brain]
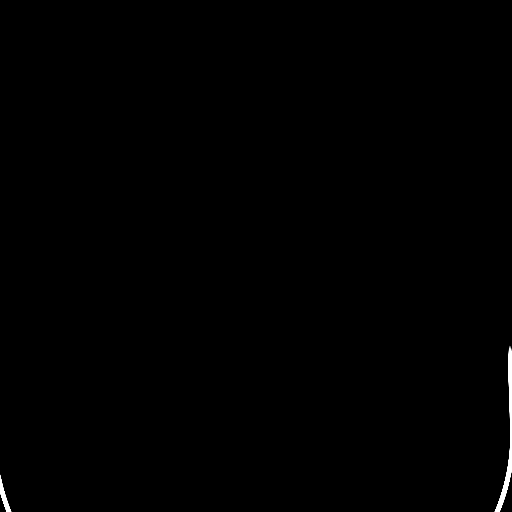

[16 of 30 positions shown; findings below may reference images not displayed]

FINDINGS: No acute intracranial abnormality. Specifically, no hemorrhage,
hydrocephalus, mass lesion, acute infarction, or significant
intracranial injury. No acute calvarial abnormality. Visualized
paranasal sinuses and mastoids clear. Orbital soft tissues
unremarkable.
IMPRESSION: Negative.

## 2018-09-12 ENCOUNTER — Encounter: Payer: Self-pay | Admitting: Internal Medicine

## 2018-09-18 ENCOUNTER — Encounter: Payer: Self-pay | Admitting: Nurse Practitioner

## 2018-11-20 ENCOUNTER — Ambulatory Visit: Payer: BLUE CROSS/BLUE SHIELD | Admitting: Nurse Practitioner

## 2018-11-28 ENCOUNTER — Ambulatory Visit: Payer: BLUE CROSS/BLUE SHIELD | Admitting: Nurse Practitioner

## 2019-03-05 ENCOUNTER — Encounter: Payer: Self-pay | Admitting: Nurse Practitioner

## 2019-03-05 ENCOUNTER — Other Ambulatory Visit: Payer: Self-pay

## 2019-03-05 ENCOUNTER — Ambulatory Visit (INDEPENDENT_AMBULATORY_CARE_PROVIDER_SITE_OTHER): Payer: BC Managed Care – PPO | Admitting: Nurse Practitioner

## 2019-03-05 DIAGNOSIS — K219 Gastro-esophageal reflux disease without esophagitis: Secondary | ICD-10-CM | POA: Insufficient documentation

## 2019-03-05 MED ORDER — PANTOPRAZOLE SODIUM 40 MG PO TBEC: 40.0000 mg | DELAYED_RELEASE_TABLET | Freq: Every day | ORAL | 2 refills | Status: DC

## 2019-03-05 NOTE — Progress Notes (Signed)
CC'D TO PCP °

## 2019-03-05 NOTE — Patient Instructions (Signed)
Your health issues we discussed today were:   GERD (reflux/heartburn): 1. Continue to avoid "trigger substances "which are things that make your symptoms worse including certain foods or beverages 2. Avoid eating 3 to 4 hours prior to going to sleep 3. Avoid bending forward or heavy lifting immediately after eating 4. Start taking Protonix 40 mg once a day. 5. Call us in 2 to 3 weeks and let us know how Protonix is helping your symptoms.  If not, we will start a prior authorization for Zegerid 6. Call us if you have any worsening or severe symptoms.  Specifically, notify us of any black stools. 7. Try to limit the amount of NSAIDs you take (aspirin, ibuprofen, Motrin, Advil, Aleve, naproxen, Naprosyn, Goody powders, BC powders).  Overall I recommend:  1. Continue your other current medications 2. Follow-up in 2 months 3. Call us if you have any questions or concerns   Because of recent events of COVID-19 ("Coronavirus"), follow CDC recommendations:  1. Wash your hand frequently 2. Avoid touching your face 3. Stay away from people who are sick 4. If you have symptoms such as fever, cough, shortness of breath then call your healthcare provider for further guidance 5. If you are sick, STAY AT HOME unless otherwise directed by your healthcare provider. 6. Follow directions from state and national officials regarding staying safe   At [facility] we value your feedback. You may receive a survey about your visit today. Please share your experience as we strive to create trusting relationships with our patients to provide genuine, compassionate, quality care.  We appreciate your understanding and patience as we review any laboratory studies, imaging, and other diagnostic tests that are ordered as we care for you. Our office policy is 5 business days for review of these results, and any emergent or urgent results are addressed in a timely manner for your best interest. If you do  not hear from our office in 1 week, please contact us.   We also encourage the use of MyChart, which contains your medical information for your review as well. If you are not enrolled in this feature, an access code is on this after visit summary for your convenience. Thank you for allowing [place] to be involved in your care.  It was great to see you today!  I hope you have a great sumer!!

## 2019-03-05 NOTE — Progress Notes (Signed)
Primary Care Physician:  Jerel Shepherd, FNP Primary Gastroenterologist:  Dr. Gala Romney  Chief Complaint  Patient presents with  . Abdominal Pain    mid upper abd, worse after eating and laying down  . Gastroesophageal Reflux    HPI:   Joan Collins is a 63 y.o. female who presents on referral from primary care to evaluate abdominal pain.  Reviewed information provided with referral including office visit dated 09/05/2018.  This is an exam for a yearly physical.  At that time noted Zegerid helping her GERD symptoms and is tried multiple medications over the previous 30 years including Nexium and ranitidine but Cipro is been the only thing that helps and they are requiring a prior authorization.  Noted to eat small bites of food will vomit sometimes she bends forward, worsening epigastric abdominal pain after eating.  Avoid spicy foods.  She has never had an upper endoscopy completed.  Recommended H. pylori testing.   Reviewed labs including CBC which was essentially normal, CMP also essentially normal.  On referral notes noted negative H. pylori testing.  Colonoscopy up-to-date 2015 and next due in 2025.  No history of endoscopy in our system.  Today she states she's doing ok overall. Has had GERD for about 30 years. Has tried multiple medications (both Rx and OTC) and only thing really effective is Zegrid, which currently requires a PA. She has been taking OTC medication sin the meantime to try and help . Has previously tried Nexium, Prilosec, Nexium, Ranitadine. It appears she has not tried Protonix, Dexilant, Symptoms are worse after eating. Has been having more problems when laying down (cannot lay flat on her back). Last meal of the day is typically 1:30 am (works third shift) and typically goes to sleep between 7 am to 9 am.) Typical triggers are some vegetables, spicy foods, tomato-based products, citrus. She actively tries to avoid these. Will sometimes have vomiting if she bends  forward at work soon after eating; has been improving by changing her activity patterns at work. Epigastric pain as per above. Denies other overt N/V, hematochezia, melena, fever, chills, unintentional weight loss. Denies URI or flu-like symptoms. Denies loss of sense of taste or smell. Denies chest pain, dyspnea, dizziness, lightheadedness, syncope, near syncope. Denies any other upper or lower GI symptoms.  Takes Excedrin migraine about twice a day.  Past Medical History:  Diagnosis Date  . Arthritis   . GERD (gastroesophageal reflux disease)   . Headache(784.0)    Migraines    Past Surgical History:  Procedure Laterality Date  . COLONOSCOPY N/A 05/04/2014   Procedure: COLONOSCOPY;  Surgeon: Daneil Dolin, MD;  Location: [facility];  Service: Endoscopy;  Laterality: N/A;  7:30 AM  . TUBAL LIGATION      Current Outpatient Medications  Medication Sig Dispense Refill  . aspirin-acetaminophen-caffeine (EXCEDRIN MIGRAINE) 250-250-65 MG per tablet Take 1 tablet by mouth every 6 (six) hours as needed for headache.    Marland Kitchen atorvastatin (LIPITOR) 80 MG tablet Take 80 mg by mouth daily.    . Boswell[place]-Glucosamine-Vit D (OSTEO BI-FLEX ONE PER DAY PO) Take by mouth daily.    . cyanocobalamin 1000 MCG tablet Take 1,000 mcg by mouth daily.    . hydrochlorothiazide (HYDRODIURIL) 25 MG tablet Take 1 tablet (25 mg total) by mouth daily. 30 tablet 0  . TURMERIC PO Take 2 tablets by mouth daily.     . Cholecalciferol (VITAMIN D) 50 MCG (2000 UT) tablet Take 2,000 Units  by mouth daily.    . pantoprazole (PROTONIX) 40 MG tablet Take 1 tablet (40 mg total) by mouth daily. 30 tablet 2   No current facility-administered medications for this visit.     Allergies as of 03/05/2019  . (No Known Allergies)    Family History  Problem Relation Age of Onset  . Colon cancer Neg Hx     Social History   Socioeconomic History  . Marital status: Married    Spouse name: Not on file  . Number of children:  Not on file  . Years of education: Not on file  . Highest education level: Not on file  Occupational History  . Not on file  Social Needs  . Financial resource strain: Not on file  . Food insecurity    Worry: Not on file    Inability: Not on file  . Transportation needs    Medical: Not on file    Non-medical: Not on file  Tobacco Use  . Smoking status: Current Every Day Smoker    Packs/day: 0.50    Years: 20.00    Pack years: 10.00    Types: Cigarettes  . Smokeless tobacco: Never Used  Substance and Sexual Activity  . Alcohol use: No  . Drug use: No  . Sexual activity: Not on file  Lifestyle  . Physical activity    Days per week: Not on file    Minutes per session: Not on file  . Stress: Not on file  Relationships  . Social Herbalist on phone: Not on file    Gets together: Not on file    Attends religious service: Not on file    Active member of club or organization: Not on file    Attends meetings of clubs or organizations: Not on file    Relationship status: Not on file  . Intimate partner violence    Fear of current or ex partner: Not on file    Emotionally abused: Not on file    Physically abused: Not on file    Forced sexual activity: Not on file  Other Topics Concern  . Not on file  Social History Narrative  . Not on file    Review of Systems: General: Negative for anorexia, weight loss, fever, chills, fatigue, weakness. ENT: Negative for hoarseness, difficulty swallowing. CV: Negative for chest pain, angina, palpitations, peripheral edema.  Respiratory: Negative for dyspnea at rest, cough, sputum, wheezing.  GI: See history of present illness. MS: Negative for joint pain, low back pain.  Derm: Negative for rash or itching.  Endo: Negative for unusual weight change.  Heme: Negative for bruising or bleeding. Allergy: Negative for rash or hives.    Physical Exam: BP 139/83   Pulse 79   Temp (!) 97.1 F (36.2 C) (Oral)   Ht 5\' 1"  (1.549  m)   Wt 100 lb 9.6 oz (45.6 kg)   BMI 19.01 kg/m  General:   Alert and oriented. Pleasant and cooperative. Well-nourished and well-developed.  Head:  Normocephalic and atraumatic. Eyes:  Without icterus, sclera clear and conjunctiva pink.  Ears:  Normal auditory acuity. Cardiovascular:  S1, S2 present without murmurs appreciated. Extremities without clubbing or edema. Respiratory:  Clear to auscultation bilaterally. No wheezes, rales, or rhonchi. No distress.  Gastrointestinal:  +BS, soft, non-tender and non-distended. No HSM noted. No guarding or rebound. No masses appreciated.  Rectal:  Deferred  Musculoskalatal:  Symmetrical without gross deformities. Neurologic:  Alert and oriented x4;  grossly normal neurologically. Psych:  Alert and cooperative. Normal mood and affect. Heme/Lymph/Immune: No excessive bruising noted.    03/05/2019 10:30 AM   Disclaimer: This note was dictated with voice recognition software. Similar sounding words can inadvertently be transcribed and may not be corrected upon review.

## 2019-03-05 NOTE — Assessment & Plan Note (Signed)
The patient has a chronic history of GERD for 30 years.  Her symptoms have worsened since her medication that has worked well, Zegerid, now requires a prior off.  She is not currently on any medications.  She is tried multiple medications for years, as per HPI.  She has not tried Protonix.  I will start her on Protonix 40 mg daily and request a Park score to 3 weeks.  If no improvement on Protonix we will initiate a prior authorization for Zegerid given the number of medication she is tried.  Return for follow-up in 2 months.  No red flag/warning signs or symptoms today that necessitated an upper endoscopy.

## 2019-03-25 ENCOUNTER — Telehealth: Payer: Self-pay | Admitting: Internal Medicine

## 2019-03-25 NOTE — Telephone Encounter (Signed)
Tried calling pt. Pt is currently at work per the family member that answered that phone.

## 2019-03-25 NOTE — Telephone Encounter (Signed)
Pt called to say that she had seen EG recently and was calling back to let him know that it was working pretty good for her.

## 2019-03-26 NOTE — Telephone Encounter (Signed)
EG, I wasn't able to get in touch with pt. She was calling to let you know that the regimen that was given at her apt is working for her.

## 2019-03-27 NOTE — Telephone Encounter (Signed)
Pt notified of EG's recommendations.

## 2019-03-27 NOTE — Telephone Encounter (Signed)
I'm glad to hear Protonix is helping. Have her keep taking it and follow-up as previously scheduled.  Let me know if any questions or concerns.

## 2019-05-06 ENCOUNTER — Ambulatory Visit: Payer: BC Managed Care – PPO | Admitting: Nurse Practitioner

## 2019-05-21 ENCOUNTER — Other Ambulatory Visit: Payer: Self-pay

## 2019-05-21 ENCOUNTER — Ambulatory Visit: Payer: BC Managed Care – PPO | Admitting: Nurse Practitioner

## 2019-05-21 ENCOUNTER — Encounter: Payer: Self-pay | Admitting: Nurse Practitioner

## 2019-05-21 VITALS — BP 120/69 | HR 99 | Temp 96.8°F | Ht 61.0 in | Wt 101.4 lb

## 2019-05-21 DIAGNOSIS — K219 Gastro-esophageal reflux disease without esophagitis: Secondary | ICD-10-CM | POA: Diagnosis not present

## 2019-05-21 DIAGNOSIS — R110 Nausea: Secondary | ICD-10-CM | POA: Diagnosis not present

## 2019-05-21 MED ORDER — PANTOPRAZOLE SODIUM 40 MG PO TBEC: 40.0000 mg | DELAYED_RELEASE_TABLET | Freq: Every day | ORAL | 3 refills | Status: DC

## 2019-05-21 NOTE — Assessment & Plan Note (Signed)
One episode of nausea without vomiting.  This is likely due to a breakthrough episode of GERD.  Recommend she continue her PPI.  Use breakthrough medications for GERD symptoms such as peppermint, Tums, Rolaids.  Follow-up in 1 year.  Notify us of any recurrent or frequent symptoms.

## 2019-05-21 NOTE — Progress Notes (Signed)
Referring Provider: Jerel Shepherd, FNP Primary Care Physician:  Jerel Shepherd, FNP Primary GI:  Dr. Gala Romney  Chief Complaint  Patient presents with  . Gastroesophageal Reflux    occ    HPI:   Joan Collins is a 63 y.o. female who presents for follow-up on GERD.  Patient was last seen in our office 03/05/2019 for the same.  At that time noted Zegerid helping her GERD symptoms and is tried multiple medications over the previous 30 years including Nexium and ranitidine, but Cipro has been the only thing that has helped.  Colonoscopy up-to-date 2015 next due in 2025.  No endoscopy in our system.  At her last visit she noted chronic GERD and the only thing that is effective is Zegerid which currently requires a prior authorization.  Is awaiting prior off and has been taking OTC medications in the meantime to try and help.  Has previously tried Bell Nexium, Prilosec, ranitidine.  It appears she has not tried Protonix or Dexilant.  Symptoms worse postprandial and when laying flat.  Typical triggers are some vegetables, spicy foods, tomato based products, and citrus.  She tries to avoid these.  Occasional regurgitation/vomiting if she bends forward soon after eating.  Also noted epigastric pain.  No other GI complaints.  Takes Excedrin Migraine about twice a day.  No other NSAIDs.  Recommended avoid triggers, avoid eating 3 to 4 hours prior to sleep, avoid bending forward or heavy lifting immediately after eating.  Trial Protonix 40 mg once daily with request for progress report in 2 to 3 weeks.  If this is ineffective we can start a prior authorization for Zegerid.  Limit the amount of NSAIDs, try to avoid them altogether.  Follow-up in 2 months.  She called our office as requested with a progress report indicating Protonix was helping.  Recommended she continue this twice daily and follow-up as previously scheduled.  Today she states she's doing better than she was. Has had improvement on  Protonix bid. Has breakthrough GERD symptoms only twice in the past 2.5 months; only once was bad. When she had her bad episode she got nauseated. When this happens, she takes peppermint and this helps. Denies abdominal pain, N/V, hematochezia, melena, fever, chills, unintentional weight loss. Denies URI or flu-like symptoms. Denies loss of sense of taste or smell. Denies chest pain, dyspnea, dizziness, lightheadedness, syncope, near syncope. Denies any other upper or lower GI symptoms.  Past Medical History:  Diagnosis Date  . Arthritis   . GERD (gastroesophageal reflux disease)   . Headache(784.0)    Migraines    Past Surgical History:  Procedure Laterality Date  . COLONOSCOPY N/A 05/04/2014   Procedure: COLONOSCOPY;  Surgeon: Daneil Dolin, MD;  Location: [facility];  Service: Endoscopy;  Laterality: N/A;  7:30 AM  . TUBAL LIGATION      Current Outpatient Medications  Medication Sig Dispense Refill  . aspirin-acetaminophen-caffeine (EXCEDRIN MIGRAINE) 250-250-65 MG per tablet Take 1 tablet by mouth every 6 (six) hours as needed for headache.    Marland Kitchen atorvastatin (LIPITOR) 80 MG tablet Take 80 mg by mouth daily.    . Boswellia-Glucosamine-Vit D (OSTEO BI-FLEX ONE PER DAY PO) Take by mouth daily.    . Cholecalciferol (VITAMIN D) 50 MCG (2000 UT) tablet Take 2,000 Units by mouth daily.    . cyanocobalamin 1000 MCG tablet Take 1,000 mcg by mouth daily.    . hydrochlorothiazide (HYDRODIURIL) 25 MG tablet Take 1 tablet (25  mg total) by mouth daily. 30 tablet 0  . pantoprazole (PROTONIX) 40 MG tablet Take 1 tablet (40 mg total) by mouth daily. 30 tablet 2  . TURMERIC PO Take 2 tablets by mouth daily.      No current facility-administered medications for this visit.     Allergies as of 05/21/2019  . (No Known Allergies)    Family History  Problem Relation Age of Onset  . Colon cancer Neg Hx     Social History   Socioeconomic History  . Marital status: Married    Spouse name:  Not on file  . Number of children: Not on file  . Years of education: Not on file  . Highest education level: Not on file  Occupational History  . Not on file  Social Needs  . Financial resource strain: Not on file  . Food insecurity    Worry: Not on file    Inability: Not on file  . Transportation needs    Medical: Not on file    Non-medical: Not on file  Tobacco Use  . Smoking status: Current Every Day Smoker    Packs/day: 0.50    Years: 20.00    Pack years: 10.00    Types: Cigarettes  . Smokeless tobacco: Never Used  Substance and Sexual Activity  . Alcohol use: No  . Drug use: No  . Sexual activity: Not on file  Lifestyle  . Physical activity    Days per week: Not on file    Minutes per session: Not on file  . Stress: Not on file  Relationships  . Social Herbalist on phone: Not on file    Gets together: Not on file    Attends religious service: Not on file    Active member of club or organization: Not on file    Attends meetings of clubs or organizations: Not on file    Relationship status: Not on file  Other Topics Concern  . Not on file  Social History Narrative  . Not on file    Review of Systems: General: Negative for anorexia, weight loss, fever, chills, fatigue, weakness. ENT: Negative for hoarseness, difficulty swallowing. CV: Negative for chest pain, angina, palpitations, peripheral edema.  Respiratory: Negative for dyspnea at rest, cough, sputum, wheezing.  GI: See history of present illness. Endo: Negative for unusual weight change.  Heme: Negative for bruising or bleeding. Allergy: Negative for rash or hives.   Physical Exam: BP 120/69   Pulse 99   Temp (!) 96.8 F (36 C) (Temporal)   Ht 5\' 1"  (1.549 m)   Wt 101 lb 6.4 oz (46 kg)   BMI 19.16 kg/m  General:   Alert and oriented. Pleasant and cooperative. Well-nourished and well-developed.  Eyes:  Without icterus, sclera clear and conjunctiva pink.  Ears:  Normal auditory  acuity. Cardiovascular:  S1, S2 present without murmurs appreciated. Extremities without clubbing or edema. Respiratory:  Clear to auscultation bilaterally. No wheezes, rales, or rhonchi. No distress.  Gastrointestinal:  +BS, soft, non-tender and non-distended. No HSM noted. No guarding or rebound. No masses appreciated.  Rectal:  Deferred  Musculoskalatal:  Symmetrical without gross deformities. Neurologic:  Alert and oriented x4;  grossly normal neurologically. Psych:  Alert and cooperative. Normal mood and affect. Heme/Lymph/Immune: No excessive bruising noted.    05/21/2019 2:42 PM   Disclaimer: This note was dictated with voice recognition software. Similar sounding words can inadvertently be transcribed and may not be corrected upon  review.

## 2019-05-21 NOTE — Assessment & Plan Note (Signed)
GERD symptoms significantly improved on Protonix twice daily.  No need to continue prior off for Zegerid.  Recommend she continue her current medication.  Use breakthrough medication such as peppermint, Tums, Rolaids.  Follow-up in 1 year.  Notify us of any worsening or severe symptoms.  I will send a 25-month supply of her medication per her request.

## 2019-05-21 NOTE — Patient Instructions (Signed)
Your health issues we discussed today were:   GERD (reflux/heartburn) with nausea: 1. Glad your symptoms are doing better 2. Continue taking Protonix 3. If you have "breakthrough" GERD symptoms you can use peppermint, Tums, Rolaids, etc. as needed for improvement 4. Call us if your breakthrough symptoms become more frequent or if your symptoms become severe  Overall I recommend:  1. Return for follow-up in 1 year 2. Call us if you have any questions or concerns 3. Continue your other current medications   Because of recent events of COVID-19 ("Coronavirus"), follow CDC recommendations:  1. Wash your hand frequently 2. Avoid touching your face 3. Stay away from people who are sick 4. If you have symptoms such as fever, cough, shortness of breath then call your healthcare provider for further guidance 5. If you are sick, STAY AT HOME unless otherwise directed by your healthcare provider. 6. Follow directions from state and national officials regarding staying safe   At [facility] we value your feedback. You may receive a survey about your visit today. Please share your experience as we strive to create trusting relationships with our patients to provide genuine, compassionate, quality care.  We appreciate your understanding and patience as we review any laboratory studies, imaging, and other diagnostic tests that are ordered as we care for you. Our office policy is 5 business days for review of these results, and any emergent or urgent results are addressed in a timely manner for your best interest. If you do not hear from our office in 1 week, please contact us.   We also encourage the use of MyChart, which contains your medical information for your review as well. If you are not enrolled in this feature, an access code is on this after visit summary for your convenience. Thank you for allowing [place] to be involved in your care.  It was great to see you today!  I hope you  have a great Fall!!

## 2019-06-03 ENCOUNTER — Telehealth: Payer: Self-pay

## 2019-06-03 NOTE — Telephone Encounter (Signed)
Received a call from the NP at the nursing facility pt resides. Clarification was needed on the dosage of Pantoprazole and instructions. Per EG's noted, pt is to take Pantoprazole 40 mg once daily.

## 2019-07-22 ENCOUNTER — Encounter: Payer: Self-pay | Admitting: Orthopaedic Surgery

## 2019-08-28 ENCOUNTER — Ambulatory Visit: Payer: BC Managed Care – PPO

## 2019-08-28 ENCOUNTER — Ambulatory Visit (INDEPENDENT_AMBULATORY_CARE_PROVIDER_SITE_OTHER): Payer: BC Managed Care – PPO | Admitting: Orthopaedic Surgery

## 2019-08-28 ENCOUNTER — Other Ambulatory Visit: Payer: Self-pay

## 2019-08-28 ENCOUNTER — Encounter: Payer: Self-pay | Admitting: Orthopaedic Surgery

## 2019-08-28 VITALS — BP 135/76 | HR 75 | Temp 97.7°F | Ht 61.0 in | Wt 100.0 lb

## 2019-08-28 DIAGNOSIS — F17210 Nicotine dependence, cigarettes, uncomplicated: Secondary | ICD-10-CM | POA: Diagnosis not present

## 2019-08-28 DIAGNOSIS — M25551 Pain in right hip: Secondary | ICD-10-CM | POA: Diagnosis not present

## 2019-08-28 DIAGNOSIS — M545 Low back pain, unspecified: Secondary | ICD-10-CM

## 2019-08-28 DIAGNOSIS — G8929 Other chronic pain: Secondary | ICD-10-CM

## 2019-08-28 DIAGNOSIS — M25552 Pain in left hip: Secondary | ICD-10-CM

## 2019-08-28 MED ORDER — PREDNISONE 5 MG (21) PO TBPK: ORAL_TABLET | ORAL | 0 refills | Status: DC

## 2019-08-28 NOTE — Progress Notes (Signed)
Subjective:    Patient ID: Joan Collins, female    DOB: 1956-04-26, 64 y.o.   MRN: [id number]  HPI She has long, over twenty year, history of lower back pain.  She has had more pain in the lower back and hips over the last year. She is followed at [facility].  I have reviewed their notes. They are also concerned about hip pain.  She cannot take NSAIDs. She has tried Tylenol, rubs, heat and ice with no help.  She has more pain at night.  She is working. She has no trauma, no weakness. She has some pain to both lower legs at time. She also has some night spasm of the feet and calves.     Review of Systems  Constitutional: Positive for activity change.  Musculoskeletal: Positive for arthralgias, back pain, gait problem and myalgias.  Neurological: Positive for headaches.  All other systems reviewed and are negative.  For Review of Systems, all other systems reviewed and are negative.  The following is a summary of the past history medically, past history surgically, known current medicines, social history and family history.  This information is gathered electronically by the computer from prior information and documentation.  I review this each visit and have found including this information at this point in the chart is beneficial and informative.   Past Medical History:  Diagnosis Date  . Arthritis   . GERD (gastroesophageal reflux disease)   . Headache(784.0)    Migraines  . Hypertension     Past Surgical History:  Procedure Laterality Date  . CARPAL TUNNEL RELEASE Right   . COLONOSCOPY N/A 05/04/2014   Procedure: COLONOSCOPY;  Surgeon: Daneil Dolin, MD;  [facility];  Service: Endoscopy;  Laterality: N/A;  7:30 AM  . TUBAL LIGATION      Current Outpatient Medications on File Prior to Visit  Medication Sig Dispense Refill  . aspirin-acetaminophen-caffeine (EXCEDRIN MIGRAINE) 250-250-65 MG per tablet Take 1 tablet by mouth every 6 (six)  hours as needed for headache.    Marland Kitchen atorvastatin (LIPITOR) 80 MG tablet Take 80 mg by mouth daily.    . Boswellia-Glucosamine-Vit D (OSTEO BI-FLEX ONE PER DAY PO) Take by mouth daily.    . Cholecalciferol (VITAMIN D) 50 MCG (2000 UT) tablet Take 2,000 Units by mouth daily.    . cyanocobalamin 1000 MCG tablet Take 1,000 mcg by mouth daily.    . hydrochlorothiazide (HYDRODIURIL) 25 MG tablet Take 1 tablet (25 mg total) by mouth daily. 30 tablet 0  . pantoprazole (PROTONIX) 40 MG tablet Take 1 tablet (40 mg total) by mouth daily. 90 tablet 3  . TURMERIC PO Take 2 tablets by mouth daily.      No current facility-administered medications on file prior to visit.    Social History   Socioeconomic History  . Marital status: Married    Spouse name: Not on file  . Number of children: Not on file  . Years of education: Not on file  . Highest education level: Not on file  Occupational History  . Not on file  Tobacco Use  . Smoking status: Current Every Day Smoker    Packs/day: 0.50    Years: 20.00    Pack years: 10.00    Types: Cigarettes  . Smokeless tobacco: Never Used  Substance and Sexual Activity  . Alcohol use: No  . Drug use: No  . Sexual activity: Not on file  Other Topics Concern  .  Not on file  Social History Narrative  . Not on file   Social Determinants of Health   Financial Resource Strain:   . Difficulty of Paying Living Expenses: Not on file  Food Insecurity:   . Worried About Charity fundraiser in the Last Year: Not on file  . Ran Out of Food in the Last Year: Not on file  Transportation Needs:   . Lack of Transportation (Medical): Not on file  . Lack of Transportation (Non-Medical): Not on file  Physical Activity:   . Days of Exercise per Week: Not on file  . Minutes of Exercise per Session: Not on file  Stress:   . Feeling of Stress : Not on file  Social Connections:   . Frequency of Communication with Friends and Family: Not on file  . Frequency of Social  Gatherings with Friends and Family: Not on file  . Attends Religious Services: Not on file  . Active Member of Clubs or Organizations: Not on file  . Attends Archivist Meetings: Not on file  . Marital Status: Not on file  Intimate Partner Violence:   . Fear of Current or Ex-Partner: Not on file  . Emotionally Abused: Not on file  . Physically Abused: Not on file  . Sexually Abused: Not on file    Family History  Problem Relation Age of Onset  . Colon cancer Neg Hx     BP 135/76   Pulse 75   Temp 97.7 F (36.5 C)   Ht 5\' 1"  (1.549 m)   Wt 100 lb (45.4 kg)   BMI 18.89 kg/m   Body mass index is 18.89 kg/m.     Objective:   Physical Exam Vitals and nursing note reviewed.  Constitutional:      Appearance: She is well-developed.  HENT:     Head: Normocephalic and atraumatic.  Eyes:     Conjunctiva/sclera: Conjunctivae normal.     Pupils: Pupils are equal, round, and reactive to light.  Cardiovascular:     Rate and Rhythm: Normal rate and regular rhythm.  Pulmonary:     Effort: Pulmonary effort is normal.  Abdominal:     Palpations: Abdomen is soft.  Musculoskeletal:     Cervical back: Normal range of motion and neck supple.       Back:  Skin:    General: Skin is warm and dry.  Neurological:     Mental Status: She is alert and oriented to person, place, and time.     Cranial Nerves: No cranial nerve deficit.     Motor: No abnormal muscle tone.     Coordination: Coordination normal.     Deep Tendon Reflexes: Reflexes are normal and symmetric. Reflexes normal.  Psychiatric:        Behavior: Behavior normal.        Thought Content: Thought content normal.        Judgment: Judgment normal.    X-rays were done of the lumbar spine and pelvis, reported separately.        Assessment & Plan:   Encounter Diagnoses  Name Primary?  . Chronic pain of both hips   . Lumbar pain Yes  . Nicotine dependence, cigarettes, uncomplicated    I will get MRI  of the lumbar spine. She was seen 07-11-2019 at [facility] and still has lower back pain.  I am concerned about a HNP.  I will give prednisone dose pack.  Return in two weeks.  Call if any problem.  Precautions discussed.   Electronically Signed Sanjuana Kava, MD 1/7/20219:53 AM

## 2019-09-18 ENCOUNTER — Ambulatory Visit: Payer: BC Managed Care – PPO | Admitting: Orthopaedic Surgery

## 2019-09-18 ENCOUNTER — Other Ambulatory Visit: Payer: Self-pay

## 2019-09-18 ENCOUNTER — Ambulatory Visit (HOSPITAL_COMMUNITY)
Admission: RE | Admit: 2019-09-18 | Discharge: 2019-09-18 | Disposition: A | Discharge status: Home / Self Care | Payer: BC Managed Care – PPO | Source: Ambulatory Visit | Attending: Orthopaedic Surgery | Admitting: Orthopaedic Surgery

## 2019-09-18 DIAGNOSIS — M545 Low back pain, unspecified: Secondary | ICD-10-CM

## 2019-09-23 ENCOUNTER — Other Ambulatory Visit: Payer: Self-pay

## 2019-09-23 ENCOUNTER — Ambulatory Visit: Payer: BC Managed Care – PPO | Admitting: Orthopaedic Surgery

## 2019-09-23 ENCOUNTER — Encounter: Payer: Self-pay | Admitting: Orthopaedic Surgery

## 2019-09-23 VITALS — Ht 61.0 in | Wt 100.0 lb

## 2019-09-23 DIAGNOSIS — M25551 Pain in right hip: Secondary | ICD-10-CM | POA: Diagnosis not present

## 2019-09-23 DIAGNOSIS — F17210 Nicotine dependence, cigarettes, uncomplicated: Secondary | ICD-10-CM | POA: Diagnosis not present

## 2019-09-23 DIAGNOSIS — M25552 Pain in left hip: Secondary | ICD-10-CM

## 2019-09-23 DIAGNOSIS — M545 Low back pain, unspecified: Secondary | ICD-10-CM

## 2019-09-23 DIAGNOSIS — G8929 Other chronic pain: Secondary | ICD-10-CM

## 2019-09-23 MED ORDER — HYDROCODONE-ACETAMINOPHEN 5-325 MG PO TABS: ORAL_TABLET | ORAL | 0 refills | Status: DC

## 2019-09-23 NOTE — Progress Notes (Signed)
Patient [id number] Joan Collins, female DOB:September 28, 1955, 64 y.o. [id number]  Chief Complaint  Patient presents with  . Back Pain    MRI results    HPI  [name] is a 64 y.o. female who has chronic pain of the lumbar spine.  She had MRI which showed: IMPRESSION: Multilevel degenerative changes as detailed above, greatest at L4-L5.  I have explained the findings to her.  She declines epidurals.  I will call in pain medicine short term, five days.  I will see her in six weeks.  I have independently reviewed the MRI.  Body mass index is 18.89 kg/m.  ROS  Review of Systems  Constitutional: Positive for activity change.  Musculoskeletal: Positive for arthralgias, back pain, gait problem and myalgias.  Neurological: Positive for headaches.  All other systems reviewed and are negative.   All other systems reviewed and are negative.  The following is a summary of the past history medically, past history surgically, known current medicines, social history and family history.  This information is gathered electronically by the computer from prior information and documentation.  I review this each visit and have found including this information at this point in the chart is beneficial and informative.    Past Medical History:  Diagnosis Date  . Arthritis   . GERD (gastroesophageal reflux disease)   . Headache(784.0)    Migraines  . Hypertension     Past Surgical History:  Procedure Laterality Date  . CARPAL TUNNEL RELEASE Right   . COLONOSCOPY N/A 05/04/2014   Procedure: COLONOSCOPY;  Surgeon: Daneil Dolin, MD;  [facility];  Service: Endoscopy;  Laterality: N/A;  7:30 AM  . TUBAL LIGATION      Family History  Problem Relation Age of Onset  . Colon cancer Neg Hx     Social History Social History   Tobacco Use  . Smoking status: Current Every Day Smoker    Packs/day: 0.50    Years: 20.00    Pack years: 10.00    Types: Cigarettes  .  Smokeless tobacco: Never Used  Substance Use Topics  . Alcohol use: No  . Drug use: No    No Known Allergies  Current Outpatient Medications  Medication Sig Dispense Refill  . aspirin-acetaminophen-caffeine (EXCEDRIN MIGRAINE) 250-250-65 MG per tablet Take 1 tablet by mouth every 6 (six) hours as needed for headache.    Marland Kitchen atorvastatin (LIPITOR) 80 MG tablet Take 80 mg by mouth daily.    . Boswellia-Glucosamine-Vit D (OSTEO BI-FLEX ONE PER DAY PO) Take by mouth daily.    . Cholecalciferol (VITAMIN D) 50 MCG (2000 UT) tablet Take 2,000 Units by mouth daily.    . cyanocobalamin 1000 MCG tablet Take 1,000 mcg by mouth daily.    . hydrochlorothiazide (HYDRODIURIL) 25 MG tablet Take 1 tablet (25 mg total) by mouth daily. 30 tablet 0  . HYDROcodone-acetaminophen (NORCO/VICODIN) 5-325 MG tablet One tablet every four hours for pain. 30 tablet 0  . pantoprazole (PROTONIX) 40 MG tablet Take 1 tablet (40 mg total) by mouth daily. 90 tablet 3  . predniSONE (STERAPRED UNI-PAK 21 TAB) 5 MG (21) TBPK tablet Take 6 pills first day; 5 pills second day; 4 pills third day; 3 pills fourth day; 2 pills next day and 1 pill last day. 21 tablet 0  . TURMERIC PO Take 2 tablets by mouth daily.      No current facility-administered medications for this visit.     Physical Exam  Height  5\' 1"  (1.549 m), weight 100 lb (45.4 kg).  Constitutional: overall normal hygiene, normal nutrition, well developed, normal grooming, normal body habitus. Assistive device:none  Musculoskeletal: gait and station Limp none, muscle tone and strength are normal, no tremors or atrophy is present.  .  Neurological: coordination overall normal.  Deep tendon reflex/nerve stretch intact.  Sensation normal.  Cranial nerves II-XII intact.   Skin:   Normal overall no scars, lesions, ulcers or rashes. No psoriasis.  Psychiatric: Alert and oriented x 3.  Recent memory intact, remote memory unclear.  Normal mood and affect. Well groomed.   Good eye contact.  Cardiovascular: overall no swelling, no varicosities, no edema bilaterally, normal temperatures of the legs and arms, no clubbing, cyanosis and good capillary refill.  Lymphatic: palpation is normal.  Spine/Pelvis examination:  Inspection:  Overall, sacoiliac joint benign and hips nontender; without crepitus or defects.   Thoracic spine inspection: Alignment normal without kyphosis present   Lumbar spine inspection:  Alignment  with normal lumbar lordosis, without scoliosis apparent.   Thoracic spine palpation:  without tenderness of spinal processes   Lumbar spine palpation: without tenderness of lumbar area; without tightness of lumbar muscles    Range of Motion:   Lumbar flexion, forward flexion is normal without pain or tenderness    Lumbar extension is full without pain or tenderness   Left lateral bend is normal without pain or tenderness   Right lateral bend is normal without pain or tenderness   Straight leg raising is normal  Strength & tone: normal   Stability overall normal stability  All other systems reviewed and are negative   The patient has been educated about the nature of the problem(s) and counseled on treatment options.  The patient appeared to understand what I have discussed and is in agreement with it.  Encounter Diagnoses  Name Primary?  . Chronic pain of both hips Yes  . Lumbar pain   . Nicotine dependence, cigarettes, uncomplicated     PLAN Call if any problems.  Precautions discussed.  Continue current medications.   Return to clinic 6 weeks   I have reviewed the [place] web site prior to prescribing narcotic medicine for this patient.   Electronically Signed Sanjuana Kava, MD 2/2/202110:26 AM

## 2019-10-30 ENCOUNTER — Ambulatory Visit: Payer: BC Managed Care – PPO | Admitting: Orthopaedic Surgery

## 2019-10-30 ENCOUNTER — Encounter: Payer: Self-pay | Admitting: Orthopaedic Surgery

## 2019-10-30 ENCOUNTER — Other Ambulatory Visit: Payer: Self-pay

## 2019-10-30 VITALS — BP 117/78 | HR 74 | Temp 97.9°F | Ht 61.0 in | Wt 100.0 lb

## 2019-10-30 DIAGNOSIS — M545 Low back pain, unspecified: Secondary | ICD-10-CM

## 2019-10-30 DIAGNOSIS — F17210 Nicotine dependence, cigarettes, uncomplicated: Secondary | ICD-10-CM

## 2019-10-30 NOTE — Progress Notes (Signed)
Patient [id number] Joan Collins, female DOB:1956-03-17, 63 y.o. [id number]  Chief Complaint  Patient presents with  . Back Pain    Chronic back pain.    HPI  Joan Collins is a 64 y.o. female who has lower back pain.  She is improved. She has some pain but not as bad as before.  Cold days are the worst. She is working.  She is active. She is doing her exercises and taking her medicine. She has no weakness or new trauma.   Body mass index is 18.89 kg/m.  ROS  Review of Systems  Constitutional: Positive for activity change.  Musculoskeletal: Positive for arthralgias, back pain, gait problem and myalgias.  Neurological: Positive for headaches.  All other systems reviewed and are negative.   All other systems reviewed and are negative.  The following is a summary of the past history medically, past history surgically, known current medicines, social history and family history.  This information is gathered electronically by the computer from prior information and documentation.  I review this each visit and have found including this information at this point in the chart is beneficial and informative.    Past Medical History:  Diagnosis Date  . Arthritis   . GERD (gastroesophageal reflux disease)   . Headache(784.0)    Migraines  . Hypertension     Past Surgical History:  Procedure Laterality Date  . CARPAL TUNNEL RELEASE Right   . COLONOSCOPY N/A 05/04/2014   Procedure: COLONOSCOPY;  Surgeon: Daneil Dolin, MD;  [facility]: [facility];  Service: Endoscopy;  Laterality: N/A;  7:30 AM  . TUBAL LIGATION      Family History  Problem Relation Age of Onset  . Colon cancer Neg Hx     Social History Social History   Tobacco Use  . Smoking status: Current Every Day Smoker    Packs/day: 0.50    Years: 20.00    Pack years: 10.00    Types: Cigarettes  . Smokeless tobacco: Never Used  Substance Use Topics  . Alcohol use: No  . Drug use: No    No Known  Allergies  Current Outpatient Medications  Medication Sig Dispense Refill  . aspirin-acetaminophen-caffeine (EXCEDRIN MIGRAINE) 250-250-65 MG per tablet Take 1 tablet by mouth every 6 (six) hours as needed for headache.    Marland Kitchen atorvastatin (LIPITOR) 80 MG tablet Take 80 mg by mouth daily.    . Boswellia-Glucosamine-Vit D (OSTEO BI-FLEX ONE PER DAY PO) Take by mouth daily.    . Cholecalciferol (VITAMIN D) 50 MCG (2000 UT) tablet Take 2,000 Units by mouth daily.    . cyanocobalamin 1000 MCG tablet Take 1,000 mcg by mouth daily.    . hydrochlorothiazide (HYDRODIURIL) 25 MG tablet Take 1 tablet (25 mg total) by mouth daily. 30 tablet 0  . HYDROcodone-acetaminophen (NORCO/VICODIN) 5-325 MG tablet One tablet every four hours for pain. 30 tablet 0  . pantoprazole (PROTONIX) 40 MG tablet Take 1 tablet (40 mg total) by mouth daily. 90 tablet 3  . TURMERIC PO Take 2 tablets by mouth daily.      No current facility-administered medications for this visit.     Physical Exam  Blood pressure 117/78, pulse 74, temperature 97.9 F (36.6 C), height 5\' 1"  (1.549 m), weight 100 lb (45.4 kg).  Constitutional: overall normal hygiene, normal nutrition, well developed, normal grooming, normal body habitus. Assistive device:none  Musculoskeletal: gait and station Limp none, muscle tone and strength are normal, no tremors or atrophy is  present.  .  Neurological: coordination overall normal.  Deep tendon reflex/nerve stretch intact.  Sensation normal.  Cranial nerves II-XII intact.   Skin:   Normal overall no scars, lesions, ulcers or rashes. No psoriasis.  Psychiatric: Alert and oriented x 3.  Recent memory intact, remote memory unclear.  Normal mood and affect. Well groomed.  Good eye contact.  Cardiovascular: overall no swelling, no varicosities, no edema bilaterally, normal temperatures of the legs and arms, no clubbing, cyanosis and good capillary refill.  Lymphatic: palpation is normal.  Spine/Pelvis  examination:  Inspection:  Overall, sacoiliac joint benign and hips nontender; without crepitus or defects.   Thoracic spine inspection: Alignment normal without kyphosis present   Lumbar spine inspection:  Alignment  with normal lumbar lordosis, without scoliosis apparent.   Thoracic spine palpation:  without tenderness of spinal processes   Lumbar spine palpation: without tenderness of lumbar area; without tightness of lumbar muscles    Range of Motion:   Lumbar flexion, forward flexion is normal without pain or tenderness    Lumbar extension is full without pain or tenderness   Left lateral bend is normal without pain or tenderness   Right lateral bend is normal without pain or tenderness   Straight leg raising is normal  Strength & tone: normal   Stability overall normal stability  All other systems reviewed and are negative   The patient has been educated about the nature of the problem(s) and counseled on treatment options.  The patient appeared to understand what I have discussed and is in agreement with it.  Encounter Diagnoses  Name Primary?  . Lumbar pain Yes  . Nicotine dependence, cigarettes, uncomplicated     PLAN Call if any problems.  Precautions discussed.  Continue current medications.   Return to clinic 6 weeks   Electronically Signed Sanjuana Kava, MD 3/11/20219:58 AM

## 2019-12-11 ENCOUNTER — Ambulatory Visit: Payer: BC Managed Care – PPO | Admitting: Orthopaedic Surgery

## 2020-04-28 ENCOUNTER — Encounter: Payer: Self-pay | Admitting: Internal Medicine

## 2020-06-10 ENCOUNTER — Other Ambulatory Visit: Payer: Self-pay | Admitting: Nurse Practitioner

## 2020-06-10 DIAGNOSIS — K219 Gastro-esophageal reflux disease without esophagitis: Secondary | ICD-10-CM

## 2020-07-21 ENCOUNTER — Telehealth: Payer: Self-pay | Admitting: *Deleted

## 2020-07-21 ENCOUNTER — Other Ambulatory Visit: Payer: Self-pay

## 2020-07-21 ENCOUNTER — Encounter: Payer: Self-pay | Admitting: *Deleted

## 2020-07-21 ENCOUNTER — Encounter: Payer: Self-pay | Admitting: Gastroenterology

## 2020-07-21 ENCOUNTER — Ambulatory Visit (INDEPENDENT_AMBULATORY_CARE_PROVIDER_SITE_OTHER): Payer: BC Managed Care – PPO | Admitting: Gastroenterology

## 2020-07-21 DIAGNOSIS — K219 Gastro-esophageal reflux disease without esophagitis: Secondary | ICD-10-CM | POA: Diagnosis not present

## 2020-07-21 MED ORDER — PANTOPRAZOLE SODIUM 40 MG PO TBEC: 40.0000 mg | DELAYED_RELEASE_TABLET | Freq: Every day | ORAL | 3 refills | Status: DC

## 2020-07-21 NOTE — Progress Notes (Signed)
CC'ED TO PCP 

## 2020-07-21 NOTE — Progress Notes (Signed)
Primary Care Physician: Jerel Shepherd, [place]  Primary Gastroenterologist:  Garfield Cornea, MD   Chief Complaint  Patient presents with  . Gastroesophageal Reflux    doing ok    HPI: Joan Collins is a 64 y.o. female here for follow-up of reflux.  Last seen in September 2020. Last colonoscopy in 2015.  Diverticulosis.  Next colonoscopy in 2025.  No prior upper endoscopy.   Heartburn well controlled on pantoprazole 40 mg daily.  She denies any dysphagia.  She notes epigastric pain chronically especially when she lays down at night.  Does have some during the day but does not pay as much attention to it because she is busy.  Chronically has had some issues with constipation for years.  Takes bisacodyl on occasion as needed.  No melena or rectal bleeding. Recently started on Celebrex once daily for arthritis.  Provided tramadol prescription just recently, rarely takes.  No history of chronic narcotics.   Current Outpatient Medications  Medication Sig Dispense Refill  . aspirin-acetaminophen-caffeine (EXCEDRIN MIGRAINE) 250-250-65 MG per tablet Take 1 tablet by mouth every 6 (six) hours as needed for headache.    Marland Kitchen atorvastatin (LIPITOR) 80 MG tablet Take 80 mg by mouth daily.    . Boswellia-Glucosamine-Vit D (OSTEO BI-FLEX ONE PER DAY PO) Take by mouth daily.    . celecoxib (CELEBREX) 50 MG capsule Take 1 capsule by mouth daily.    . Cholecalciferol (VITAMIN D) 50 MCG (2000 UT) tablet Take 2,000 Units by mouth daily.    . cyanocobalamin 1000 MCG tablet Take 1,000 mcg by mouth daily.    Marland Kitchen ELDERBERRY PO Take by mouth daily.    . hydrochlorothiazide (HYDRODIURIL) 25 MG tablet Take 1 tablet (25 mg total) by mouth daily. 30 tablet 0  . pantoprazole (PROTONIX) 40 MG tablet Take 1 tablet by mouth once daily 90 tablet 0  . traMADol (ULTRAM) 50 MG tablet Take 50 mg by mouth daily as needed.    . TURMERIC PO Take 2 tablets by mouth daily.      No current facility-administered  medications for this visit.    Allergies as of 07/21/2020  . (No Known Allergies)   Past Medical History:  Diagnosis Date  . Arthritis   . GERD (gastroesophageal reflux disease)   . Headache(784.0)    Migraines  . Hypertension    Past Surgical History:  Procedure Laterality Date  . CARPAL TUNNEL RELEASE Right   . COLONOSCOPY N/A 05/04/2014   Dr. Gala Romney: Diverticulosis, single polyp removed, no adenomatous changes.  Next colonoscopy in 2025  . TUBAL LIGATION     Family History  Problem Relation Age of Onset  . Colon cancer Neg Hx    Social History   Tobacco Use  . Smoking status: Current Every Day Smoker    Packs/day: 0.50    Years: 20.00    Pack years: 10.00    Types: Cigarettes  . Smokeless tobacco: Never Used  Substance Use Topics  . Alcohol use: No  . Drug use: No    ROS:  General: Negative for anorexia, weight loss, fever, chills, fatigue, weakness. ENT: Negative for hoarseness, difficulty swallowing , nasal congestion. CV: Negative for chest pain, angina, palpitations, dyspnea on exertion, peripheral edema.  Respiratory: Negative for dyspnea at rest, dyspnea on exertion, cough, sputum, wheezing.  GI: See history of present illness. GU:  Negative for dysuria, hematuria, urinary incontinence, urinary frequency, nocturnal urination.  Endo: Negative for unusual weight change.  Physical Examination:   BP 122/71   Pulse 76   Temp (!) 97.1 F (36.2 C) (Temporal)   Ht 5\' 1"  (1.549 m)   Wt 99 lb 12.8 oz (45.3 kg)   BMI 18.86 kg/m   General: Well-nourished, well-developed in no acute distress.  Eyes: No icterus. Mouth: masked Lungs: Clear to auscultation bilaterally.  Heart: Regular rate and rhythm, no murmurs rubs or gallops.  Abdomen: Bowel sounds are normal, nontender, nondistended, no hepatosplenomegaly or masses, no abdominal bruits or hernia , no rebound or guarding.   Extremities: No lower extremity edema. No clubbing or deformities. Neuro: Alert  and oriented x 4   Skin: Warm and dry, no jaundice.   Psych: Alert and cooperative, normal mood and affect.  Impression/plan:  64 year old female presenting for follow-up of chronic reflux.  Typical symptoms well controlled on pantoprazole 40 mg daily.  She complains of epigastric pain worse at night.  Takes Excedrin Migraine daily.  Recently started on Celebrex.  She is at increased risk for gastritis/peptic ulcer disease as well as Barrett's esophagus.  Recommend EGD in the near future with Dr. Gala Romney, conscious sedation. ASA II.  I have discussed the risks, alternatives, benefits with regards to but not limited to the risk of reaction to medication, bleeding, infection, perforation and the patient is agreeable to proceed. Written consent to be obtained.   Continue pantoprazole 40 mg daily.  Warned of potential risk of concomitant Excedrins Celebrex use.

## 2020-07-21 NOTE — Telephone Encounter (Signed)
The following solutions for the service date entered do not require Pre-Authorization by [employer]. Please contact the health plan using the number on the back of the member's ID card to determine if a Pre-Authorization is required.

## 2020-07-21 NOTE — Patient Instructions (Addendum)
1. Continue pantoprazole 40 mg daily.  Refills sent to your pharmacy. 2. Given use of both Excedrin (aspirin) and Celebrex, you are at increased risk of gastritis and stomach ulcers.  Stay on pantoprazole to try to provide GI protection.  Monitor for abdominal pain, black or bloody stools. 3. Upper endoscopy as scheduled.

## 2020-07-21 NOTE — Progress Notes (Signed)
epig pain when laying down at night going on for years. Not as bad when sitting down. No dysphagia.

## 2020-09-09 ENCOUNTER — Other Ambulatory Visit: Payer: Self-pay | Admitting: Gastroenterology

## 2020-09-09 DIAGNOSIS — K219 Gastro-esophageal reflux disease without esophagitis: Secondary | ICD-10-CM

## 2020-09-14 ENCOUNTER — Other Ambulatory Visit (HOSPITAL_COMMUNITY)
Admission: RE | Admit: 2020-09-14 | Discharge: 2020-09-14 | Disposition: A | Discharge status: Home / Self Care | Payer: BC Managed Care – PPO | Source: Ambulatory Visit | Attending: Internal Medicine | Admitting: Internal Medicine

## 2020-09-14 ENCOUNTER — Other Ambulatory Visit: Payer: Self-pay

## 2020-09-14 DIAGNOSIS — Z7982 Long term (current) use of aspirin: Secondary | ICD-10-CM | POA: Diagnosis not present

## 2020-09-14 DIAGNOSIS — Z79899 Other long term (current) drug therapy: Secondary | ICD-10-CM | POA: Diagnosis not present

## 2020-09-14 DIAGNOSIS — Z20822 Contact with and (suspected) exposure to covid-19: Secondary | ICD-10-CM | POA: Insufficient documentation

## 2020-09-14 DIAGNOSIS — R1013 Epigastric pain: Secondary | ICD-10-CM | POA: Diagnosis present

## 2020-09-14 DIAGNOSIS — Z01812 Encounter for preprocedural laboratory examination: Secondary | ICD-10-CM | POA: Insufficient documentation

## 2020-09-14 DIAGNOSIS — Z791 Long term (current) use of non-steroidal anti-inflammatories (NSAID): Secondary | ICD-10-CM | POA: Diagnosis not present

## 2020-09-14 DIAGNOSIS — K2210 Ulcer of esophagus without bleeding: Secondary | ICD-10-CM | POA: Diagnosis not present

## 2020-09-14 DIAGNOSIS — K259 Gastric ulcer, unspecified as acute or chronic, without hemorrhage or perforation: Secondary | ICD-10-CM | POA: Diagnosis not present

## 2020-09-14 DIAGNOSIS — F17210 Nicotine dependence, cigarettes, uncomplicated: Secondary | ICD-10-CM | POA: Diagnosis not present

## 2020-09-14 LAB — SARS CORONAVIRUS 2 (TAT 6-24 HRS): SARS Coronavirus 2: NEGATIVE

## 2020-09-15 ENCOUNTER — Other Ambulatory Visit: Payer: Self-pay

## 2020-09-15 ENCOUNTER — Encounter (HOSPITAL_COMMUNITY)
Admission: RE | Disposition: A | Discharge status: Home / Self Care | Payer: Self-pay | Source: Home / Self Care | Attending: Internal Medicine

## 2020-09-15 ENCOUNTER — Ambulatory Visit (HOSPITAL_COMMUNITY)
Admission: RE | Admit: 2020-09-15 | Discharge: 2020-09-15 | Disposition: A | Discharge status: Home / Self Care | Payer: BC Managed Care – PPO | Attending: Internal Medicine | Admitting: Internal Medicine

## 2020-09-15 ENCOUNTER — Encounter (HOSPITAL_COMMUNITY): Payer: Self-pay | Admitting: Internal Medicine

## 2020-09-15 DIAGNOSIS — Z7982 Long term (current) use of aspirin: Secondary | ICD-10-CM | POA: Insufficient documentation

## 2020-09-15 DIAGNOSIS — K259 Gastric ulcer, unspecified as acute or chronic, without hemorrhage or perforation: Secondary | ICD-10-CM

## 2020-09-15 DIAGNOSIS — F17210 Nicotine dependence, cigarettes, uncomplicated: Secondary | ICD-10-CM | POA: Insufficient documentation

## 2020-09-15 DIAGNOSIS — Z20822 Contact with and (suspected) exposure to covid-19: Secondary | ICD-10-CM | POA: Insufficient documentation

## 2020-09-15 DIAGNOSIS — Z791 Long term (current) use of non-steroidal anti-inflammatories (NSAID): Secondary | ICD-10-CM | POA: Insufficient documentation

## 2020-09-15 DIAGNOSIS — Z79899 Other long term (current) drug therapy: Secondary | ICD-10-CM | POA: Insufficient documentation

## 2020-09-15 DIAGNOSIS — K2210 Ulcer of esophagus without bleeding: Secondary | ICD-10-CM | POA: Insufficient documentation

## 2020-09-15 HISTORY — DX: Nausea with vomiting, unspecified: R11.2

## 2020-09-15 HISTORY — DX: Other specified postprocedural states: Z98.890

## 2020-09-15 HISTORY — PX: ESOPHAGOGASTRODUODENOSCOPY: SHX5428

## 2020-09-15 HISTORY — PX: BIOPSY: SHX5522

## 2020-09-15 SURGERY — EGD (ESOPHAGOGASTRODUODENOSCOPY)
Anesthesia: Moderate Sedation

## 2020-09-15 MED ORDER — MIDAZOLAM HCL 5 MG/5ML IJ SOLN
INTRAMUSCULAR | Status: DC | PRN
  Administered 2020-09-15: 2 mg via INTRAVENOUS
  Administered 2020-09-15 (×2): 1 mg via INTRAVENOUS

## 2020-09-15 MED ORDER — LIDOCAINE VISCOUS HCL 2 % MT SOLN
OROMUCOSAL | Status: DC | PRN
  Administered 2020-09-15: 4 mL via OROMUCOSAL

## 2020-09-15 MED ORDER — STERILE WATER FOR IRRIGATION IR SOLN
Status: DC | PRN
  Administered 2020-09-15: 100 mL

## 2020-09-15 MED ORDER — MIDAZOLAM HCL 5 MG/5ML IJ SOLN
INTRAMUSCULAR | Status: AC
  Filled 2020-09-15: qty 10

## 2020-09-15 MED ORDER — MEPERIDINE HCL 100 MG/ML IJ SOLN
INTRAMUSCULAR | Status: DC | PRN
  Administered 2020-09-15: 25 mg via INTRAVENOUS
  Administered 2020-09-15: 15 mg via INTRAVENOUS

## 2020-09-15 MED ORDER — ONDANSETRON HCL 4 MG/2ML IJ SOLN
INTRAMUSCULAR | Status: AC
  Filled 2020-09-15: qty 2

## 2020-09-15 MED ORDER — ONDANSETRON HCL 4 MG/2ML IJ SOLN
INTRAMUSCULAR | Status: DC | PRN
  Administered 2020-09-15: 4 mg via INTRAVENOUS

## 2020-09-15 MED ORDER — MEPERIDINE HCL 50 MG/ML IJ SOLN
INTRAMUSCULAR | Status: AC
  Filled 2020-09-15: qty 1

## 2020-09-15 MED ORDER — LIDOCAINE VISCOUS HCL 2 % MT SOLN
OROMUCOSAL | Status: AC
  Filled 2020-09-15: qty 15

## 2020-09-15 MED ORDER — SODIUM CHLORIDE 0.9 % IV SOLN: INTRAVENOUS | Status: DC

## 2020-09-15 NOTE — Discharge Instructions (Signed)
EGD Discharge instructions Please read the instructions outlined below and refer to this sheet in the next few weeks. These discharge instructions provide you with general information on caring for yourself after you leave the hospital. Your doctor may also give you specific instructions. While your treatment has been planned according to the most current medical practices available, unavoidable complications occasionally occur. If you have any problems or questions after discharge, please call your doctor. ACTIVITY  You may resume your regular activity but move at a slower pace for the next 24 hours.   Take frequent rest periods for the next 24 hours.   Walking will help expel (get rid of) the air and reduce the bloated feeling in your abdomen.   No driving for 24 hours (because of the anesthesia (medicine) used during the test).   You may shower.   Do not sign any important legal documents or operate any machinery for 24 hours (because of the anesthesia used during the test).  NUTRITION  Drink plenty of fluids.   You may resume your normal diet.   Begin with a light meal and progress to your normal diet.   Avoid alcoholic beverages for 24 hours or as instructed by your caregiver.  MEDICATIONS  You may resume your normal medications unless your caregiver tells you otherwise.  WHAT YOU CAN EXPECT TODAY  You may experience abdominal discomfort such as a feeling of fullness or "gas" pains.  FOLLOW-UP  Your doctor will discuss the results of your test with you.  SEEK IMMEDIATE MEDICAL ATTENTION IF ANY OF THE FOLLOWING OCCUR:  Excessive nausea (feeling sick to your stomach) and/or vomiting.   Severe abdominal pain and distention (swelling).   Trouble swallowing.   Temperature over 101 F (37.8 C).   Rectal bleeding or vomiting of blood.    You have acid reflux irritation in your esophagus.  You also have 2 gastric ulcers.  It is recommended you stop taking Excedrin  Migraine and Celebrex (talk to your primary care doctor about alternative pain medications which will not harm your stomach)  Increase Protonix to 40 mg twice daily (take before breakfast and supper) new prescription provided.  Office visit with [place] in 3 months  Biopsies of your ulcers taken.  Further recommendations to follow after biopsy report returns for review.  At patient request I called Renee at [phone] -reviewed results and recommendations

## 2020-09-15 NOTE — Op Note (Signed)
[facility] Patient Name: [place] Procedure Date: 09/15/2020 11:08 AM MRN: [id number] Date of Birth: 1955/10/12 Attending MD: Norvel Richards , MD CSN: [id number] Age: 65 Admit Type: Outpatient Procedure:                Upper GI endoscopy Indications:              Dyspepsia Providers:                Norvel Richards, MD, Rosina Lowenstein, RN, Nelma Rothman, Technician Referring MD:              Medicines:                Midazolam 4 mg IV, Meperidine 40 mg IV Complications:            No immediate complications. Estimated Blood Loss:     Estimated blood loss was minimal. Procedure:                Pre-Anesthesia Assessment:                           - Prior to the procedure, a History and Physical                            was performed, and patient medications and                            allergies were reviewed. The patient's tolerance of                            previous anesthesia was also reviewed. The risks                            and benefits of the procedure and the sedation                            options and risks were discussed with the patient.                            All questions were answered, and informed consent                            was obtained. Prior Anticoagulants: The patient has                            taken no previous anticoagulant or antiplatelet                            agents. ASA Grade Assessment: II - A patient with                            mild systemic disease. After reviewing the risks  and benefits, the patient was deemed in                            satisfactory condition to undergo the procedure.                           After obtaining informed consent, the endoscope was                            passed under direct vision. Throughout the                            procedure, the patient's blood pressure, pulse, and                            oxygen  saturations were monitored continuously. The                            [id number]) scope was introduced through the                            mouth, and advanced to the second part of duodenum.                            The upper GI endoscopy was accomplished without                            difficulty. The patient tolerated the procedure                            well. Scope In: 11:29:25 AM Scope Out: 11:38:33 AM Total Procedure Duration: 0 hours 9 minutes 8 seconds  Findings:      Linear strips of desiccated appearing exudate circumferentially       involving the distal esophagus (8 cm up from GE junction) consistent       with esophagus dissecans. I did not see any plaques. No obvious       Barrett's epithelium. Tubular esophagus patent throughout its course.      Two non-bleeding cratered gastric ulcers with no stigmata of bleeding       were found at the pylorus. The largest lesion was 8 mm in largest       dimension. Appeared benign. Patent pylorus. Remainder of stomach       appeared normal.      The duodenal bulb and second portion of the duodenum were normal.       Pyloric channel ulcer was biopsied. Abnormal distal esophagus biopsied. 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                           - Normal duodenal bulb and second portion of the                            duodenum.                           - Moderate Sedation:      Moderate (conscious) sedation was administered by the endoscopy nurse       and supervised by the endoscopist. The following parameters were       monitored: oxygen saturation, heart rate, blood pressure, respiratory       rate, EKG, adequacy of pulmonary ventilation, and response  to care.       Total physician intraservice time was 17 minutes. Recommendation:           - Patient has a contact number available for                            emergencies. The signs and symptoms of potential                            delayed complications were discussed with the                            patient. Return to normal activities tomorrow.                            Written discharge instructions were provided to the                            patient.                           - Advance diet as tolerated. Increase Protonix to                            40 mg twice daily. Stop Excedrin and Celebrex. See                            PCP for analgesic alternatives. Follow-up on                            pathology. Repeat EGD in 12 weeks. Procedure Code(s):        --- Professional ---                           [phone], Esophagogastroduodenoscopy, flexible,                            transoral; diagnostic, including collection of                            specimen(s) by brushing or washing, when performed                            (  separate procedure)                           G0500, Moderate sedation services provided by the                            same physician or other qualified health care                            professional performing a gastrointestinal                            endoscopic service that sedation supports,                            requiring the presence of an independent trained                            observer to assist in the monitoring of the                            patient's level of consciousness and physiological                            status; initial 15 minutes of intra-service time;                            patient age 54 years or older (additional time may                            be reported with [phone], as appropriate) Diagnosis Code(s):        --- Professional ---                           K25.9, Gastric ulcer, unspecified  as acute or                            chronic, without hemorrhage or perforation                           R10.13, Epigastric pain CPT copyright 2019 American Medical Association. All rights reserved. The codes documented in this report are preliminary and upon coder review may  be revised to meet current compliance requirements. Cristopher Estimable. Geralynn Capri, MD Norvel Richards, MD 09/15/2020 11:55:21 AM This report has been signed electronically. Number of Addenda: 0

## 2020-09-15 NOTE — H&P (Signed)
@LOGO @   Primary Care Physician:  Jerel Shepherd, FNP Primary Gastroenterologist:  Dr. Gala Romney  Pre-Procedure History & Physical: HPI:  Joan Collins is a 65 y.o. female here for  Further evaluation of longstanding reflux and epigastric pain the setting of NSAID use.  Does take Protonix 40 mg once daily.  Significantly improved on Protonix.  No dysphagia. She continue using Excedrin Migraine and Celebrex on a regular basis.  Past Medical History:  Diagnosis Date  . Arthritis   . GERD (gastroesophageal reflux disease)   . Headache(784.0)    Migraines  . Hypertension   . PONV (postoperative nausea and vomiting)     Past Surgical History:  Procedure Laterality Date  . CARPAL TUNNEL RELEASE Right   . COLONOSCOPY N/A 05/04/2014   Dr. Gala Romney: Diverticulosis, single polyp removed, no adenomatous changes.  Next colonoscopy in 2025  . TUBAL LIGATION      Prior to Admission medications   Medication Sig Start Date End Date Taking? Authorizing Provider  aspirin-acetaminophen-caffeine (EXCEDRIN MIGRAINE) [phone] MG per tablet Take 1 tablet by mouth every 6 (six) hours as needed for headache.   Yes [provider]  atorvastatin (LIPITOR) 80 MG tablet Take 80 mg by mouth daily. 02/03/19  Yes [provider]  B Complex-C (B-COMPLEX WITH VITAMIN C) tablet Take 1 tablet by mouth daily.   Yes [provider]  Boswellia-Glucosamine-Vit D (OSTEO BI-FLEX ONE PER DAY PO) Take 1 tablet by mouth daily.   Yes [provider]  celecoxib (CELEBREX) 50 MG capsule Take 50 mg by mouth daily. 06/11/20  Yes [provider]  Cholecalciferol (VITAMIN D) 50 MCG (2000 UT) tablet Take 2,000 Units by mouth daily.   Yes [provider]  Cholecalciferol (VITAMIN D3) 125 MCG (5000 UT) TABS Take 5,000 Units by mouth daily.   Yes [provider]  ELDERBERRY PO Take 2 tablets by mouth daily.   Yes [provider]  hydrochlorothiazide  (HYDRODIURIL) 25 MG tablet Take 1 tablet (25 mg total) by mouth daily. 08/15/14  Yes Rancour, Annie Main, MD  pantoprazole (PROTONIX) 40 MG tablet Take 1 tablet by mouth once daily 09/09/20  Yes Mahala Menghini, PA-C  traMADol (ULTRAM) 50 MG tablet Take 50 mg by mouth at bedtime as needed (pain.). 05/25/20  Yes [provider]  Turmeric 500 MG TABS Take 1,000 mg by mouth daily.   Yes [provider]    Allergies as of 07/21/2020  . (No Known Allergies)    Family History  Problem Relation Age of Onset  . Colon cancer Neg Hx     Social History   Socioeconomic History  . Marital status: Widowed    Spouse name: Not on file  . Number of children: Not on file  . Years of education: Not on file  . Highest education level: Not on file  Occupational History  . Not on file  Tobacco Use  . Smoking status: Current Every Day Smoker    Packs/day: 0.50    Years: 20.00    Pack years: 10.00    Types: Cigarettes  . Smokeless tobacco: Never Used  Vaping Use  . Vaping Use: Never used  Substance and Sexual Activity  . Alcohol use: No  . Drug use: No  . Sexual activity: Not on file  Other Topics Concern  . Not on file  Social History Narrative  . Not on file   Social Determinants of Health   Financial Resource Strain: Not on file  Food Insecurity: Not on file  Transportation Needs: Not on file  Physical Activity: Not on file  Stress: Not on file  Social Connections: Not on file  Intimate Partner Violence: Not on file    Review of Systems: See HPI, otherwise negative ROS  Physical Exam: Ht 5\' 1"  (1.549 m)   Wt 44.5 kg   BMI 18.52 kg/m  General:   Alert,  Well-developed, well-nourished, pleasant and cooperative in NAD Skin:  Intact without significant lesions or rashes. Neck:  Supple; no masses or thyromegaly. No significant cervical adenopathy. Lungs:  Clear throughout to auscultation.   No wheezes, crackles, or rhonchi. No acute distress. Heart:  Regular rate  and rhythm; no murmurs, clicks, rubs,  or gallops. Abdomen: Non-distended, normal bowel sounds.  Soft and nontender without appreciable mass or hepatosplenomegaly.  Pulses:  Normal pulses noted. Extremities:  Without clubbing or edema.  Impression/Plan: 65 year old lady with dyspepsia and reflux symptoms longstanding longstanding NSAID use.  Tums controlled with PPI.  No prior evaluation of her upper GI tract.  Denies dysphagia. I have offered the patient a diagnostic EGD today.  The risks, benefits, limitations, alternatives and imponderables have been reviewed with the patient. Potential for esophageal dilation, biopsy, etc. have also been reviewed.  Questions have been answered. All parties agreeable.     Notice: This dictation was prepared with Dragon dictation along with smaller phrase technology. Any transcriptional errors that result from this process are unintentional and may not be corrected upon review.

## 2020-09-16 ENCOUNTER — Other Ambulatory Visit: Payer: Self-pay

## 2020-09-20 ENCOUNTER — Encounter (HOSPITAL_COMMUNITY): Payer: Self-pay | Admitting: Internal Medicine

## 2020-09-23 ENCOUNTER — Encounter: Payer: Self-pay | Admitting: Internal Medicine

## 2020-09-23 LAB — SURGICAL PATHOLOGY

## 2020-09-24 ENCOUNTER — Encounter: Payer: Self-pay | Admitting: Internal Medicine

## 2020-12-14 ENCOUNTER — Encounter: Payer: Self-pay | Admitting: Gastroenterology

## 2020-12-14 ENCOUNTER — Ambulatory Visit: Payer: BC Managed Care – PPO | Admitting: Gastroenterology

## 2020-12-14 ENCOUNTER — Other Ambulatory Visit: Payer: Self-pay

## 2020-12-14 VITALS — BP 122/62 | HR 66 | Temp 97.3°F | Ht 61.0 in | Wt 102.6 lb

## 2020-12-14 DIAGNOSIS — K219 Gastro-esophageal reflux disease without esophagitis: Secondary | ICD-10-CM | POA: Diagnosis not present

## 2020-12-14 DIAGNOSIS — K279 Peptic ulcer, site unspecified, unspecified as acute or chronic, without hemorrhage or perforation: Secondary | ICD-10-CM | POA: Diagnosis not present

## 2020-12-14 NOTE — Patient Instructions (Signed)
1. Try cutting back on the pantoprazole to 40mg  once daily before a meal. If no recurrent abdominal pain, nausea, or heartburn, then you can continue once daily. If you have recurrent symptoms, then go back to twice daily.  2. Whenever you are ready to schedule upper endoscopy, please let us know. The biggest reason to have the test done is to make sure your esophagus and ulcers healed and that there is no underlying cancer.  3. Call if you have any questions or concerns. 4. We will make a follow up in four months.

## 2020-12-14 NOTE — Progress Notes (Signed)
Primary Care Physician:  Jerel Shepherd, [place]  Primary Gastroenterologist:  Garfield Cornea, MD   Chief Complaint  Patient presents with  . Gastroesophageal Reflux    F/u. Doing fine per pt. Only had few reflux episodes but relates to something she ate    HPI:  Joan Collins is a 65 y.o. female here for follow-up.  Patient was seen back in December with complaints of epigastric pain.  Had been recently started on Celebrex for arthritis.  Also taking Excedrin Migraine as needed for headache.  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 pylori, felt to be NSAID and/or aspirin related.  Repeat EGD recommended in 3 months.  Pantoprazole was increased to twice daily.  Feeling better.  Only a few episodes of reflux but related to something she ate.  No dysphagia.  Currently on pantoprazole twice daily since her endoscopy.  No nausea or vomiting.  Appetite good.  No significant abdominal pain.  Chronically has issues with constipation.  Has a bowel movement several times per week.  No blood in the stool or melena.  Mother-in-law recently passed away.  Has a lot of legal issues to handle.  Also lost her survivors benefits from when her husband passed away 3 years ago.  Financially having some hardships right now and cannot pursue endoscopy.  Part of that also because she is having difficulty getting time off of work given missed work recently with her mother-in-law's passing.  Current Outpatient Medications  Medication Sig Dispense Refill  . atorvastatin (LIPITOR) 80 MG tablet Take 80 mg by mouth daily.    . B Complex-C (B-COMPLEX WITH VITAMIN C) tablet Take 1 tablet by mouth daily.    . Boswellia-Glucosamine-Vit D (OSTEO BI-FLEX ONE PER DAY PO) Take 1 tablet by mouth daily.    . Cholecalciferol (VITAMIN D3) 125 MCG (5000 UT) TABS Take 5,000  Units by mouth daily.    Marland Kitchen ELDERBERRY PO Take 2 tablets by mouth daily.    . hydrochlorothiazide (HYDRODIURIL) 25 MG tablet Take 1 tablet (25 mg total) by mouth daily. 30 tablet 0  . pantoprazole (PROTONIX) 40 MG tablet Take 1 tablet by mouth in the morning and at bedtime.    . traMADol (ULTRAM) 50 MG tablet Take 50 mg by mouth at bedtime as needed (pain.).    . Turmeric 500 MG TABS Take 1,000 mg by mouth daily.     No current facility-administered medications for this visit.    Allergies as of 12/14/2020  . (No Known Allergies)     ROS:  General: Negative for anorexia, weight loss, fever, chills, fatigue, weakness. ENT: Negative for hoarseness, difficulty swallowing , nasal congestion. CV: Negative for chest pain, angina, palpitations, dyspnea on exertion, peripheral edema.  Respiratory: Negative for dyspnea at rest, dyspnea on exertion, cough, sputum, wheezing.  GI: See history of present illness. GU:  Negative for dysuria, hematuria, urinary incontinence, urinary frequency, nocturnal urination.  Endo: Negative for unusual weight change.    Physical Examination:  BP 122/62   Pulse 66   Temp (!) 97.3 F (36.3 C)   Ht 5\' 1"  (1.549 m)   Wt 102 lb 9.6 oz (46.5 kg)   BMI 19.39 kg/m    Physical Examination:  General: Well-nourished, well-developed in no acute distress.  Eyes: No icterus. Mouth: masked Abdomen: Bowel sounds are normal, nontender, nondistended, no hepatosplenomegaly or masses, no abdominal  bruits or hernia , no rebound or guarding.   Extremities: No lower extremity edema.  Neuro: Alert and oriented x 4   Skin: Warm and dry, no jaundice.   Psych: Alert and cooperative, normal mood and affect.  Assessment/plan:  Pleasant 65 year old female presenting for follow-up of GERD/gastric ulcers.  EGD in January with esophagus dissecans, 2 gastric ulcers (nonbleeding).  Esophageal biopsy with no evidence of HSV/CMV/fungal elements.  No malignancy seen.  Gastric  biopsies negative for H. pylori.  No malignancy seen.  Pantoprazole increased to twice daily.  Advised to stop Excedrin Migraine and Celebrex.  Patient clinically doing better.  She has been on twice daily PPI for 3 months.  Due for surveillance EGD at this time but the patient wants to postpone due to concerns outlined above.  She will call back when she is ready to schedule.  In the interim she can try dropping down to once daily pantoprazole if tolerated.  Continue to avoid aspirin and NSAIDs.  We will plan to see her back in 4 months for follow-up but we can schedule upper endoscopy in the interim if she calls to schedule.

## 2021-04-13 ENCOUNTER — Other Ambulatory Visit: Payer: Self-pay

## 2021-04-13 ENCOUNTER — Ambulatory Visit (INDEPENDENT_AMBULATORY_CARE_PROVIDER_SITE_OTHER): Payer: BLUE CROSS/BLUE SHIELD | Admitting: Gastroenterology

## 2021-04-13 ENCOUNTER — Encounter: Payer: Self-pay | Admitting: Gastroenterology

## 2021-04-13 ENCOUNTER — Encounter: Payer: Self-pay | Admitting: *Deleted

## 2021-04-13 VITALS — BP 122/71 | HR 75 | Temp 97.1°F | Ht 61.0 in | Wt 102.2 lb

## 2021-04-13 DIAGNOSIS — K2100 Gastro-esophageal reflux disease with esophagitis, without bleeding: Secondary | ICD-10-CM

## 2021-04-13 DIAGNOSIS — K279 Peptic ulcer, site unspecified, unspecified as acute or chronic, without hemorrhage or perforation: Secondary | ICD-10-CM

## 2021-04-13 NOTE — H&P (View-Only) (Signed)
Primary Care Physician: Jerel Shepherd, FNP  Primary Gastroenterologist:  Garfield Cornea, MD   Chief Complaint  Patient presents with   Gastroesophageal Reflux     To discuss another endoscopy     HPI: [name] is a 65 y.o. female here for follow-up.  Patient last seen in April.  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 pylori, felt to be NSAID and/or aspirin related.  Repeat EGD recommended in 3 months.  Pantoprazole was increased to twice daily.  She had been on Celebrex for arthritis and Excedrin Migraine for headache.  Took off celebrex. Topical regimens and Tramadol not helping arthritis pain. States she continues to take occasional Excedrin for migraines.  She complains of epigastric pressure when she lays down at night.  Also notes that if she eats the wrong things.  For the most part no abdominal pain.  No melena, rectal bleeding, dysphagia.  No heartburn.  Bowel movements regular.  Current Outpatient Medications  Medication Sig Dispense Refill   ASPERCREME LIDOCAINE EX Apply 1 application topically 4 (four) times daily as needed (hip pain).     aspirin-acetaminophen-caffeine (EXCEDRIN MIGRAINE) 250-250-65 MG tablet Take 2 tablets by mouth 2 (two) times daily as needed for migraine.     atorvastatin (LIPITOR) 80 MG tablet Take 80 mg by mouth daily.     Boswellia-Glucosamine-Vit D (OSTEO BI-FLEX ONE PER DAY PO) Take 1 tablet by mouth in the morning.     Cholecalciferol (VITAMIN D3) 125 MCG (5000 UT) TABS Take 5,000 Units by mouth daily.     ELDERBERRY PO Take 3,200 mg by mouth in the morning.     hydrochlorothiazide (HYDRODIURIL) 25 MG tablet Take 1 tablet (25 mg total) by mouth daily. 30 tablet 0   pantoprazole (PROTONIX) 40 MG tablet Take 40 mg by mouth in the morning and at bedtime.     traMADol  (ULTRAM) 50 MG tablet Take 50 mg by mouth at bedtime as needed (pain.).     Turmeric 500 MG TABS Take 500 mg by mouth in the morning.     vitamin B-12 (CYANOCOBALAMIN) 1000 MCG tablet Take 1,000 mcg by mouth in the morning.     No current facility-administered medications for this visit.    Allergies as of 04/13/2021   (No Known Allergies)   Past Medical History:  Diagnosis Date   Arthritis    GERD (gastroesophageal reflux disease)    Headache(784.0)    Migraines   Hypertension    PONV (postoperative nausea and vomiting)    Past Surgical History:  Procedure Laterality Date   BIOPSY  09/15/2020   Procedure: BIOPSY;  Surgeon: Daneil Dolin, MD;  Location: [facility];  Service: Endoscopy;;   CARPAL TUNNEL RELEASE Right    COLONOSCOPY N/A 05/04/2014   Dr. Gala Romney: Diverticulosis, single polyp removed, no adenomatous changes.  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  Nonbleeding gastric ulcers likely NSAID related/celebrex/excedrin (biopsy with ulcer with reactive changes, no malignancy, no H. pylori).   TUBAL LIGATION     Family History  Problem Relation Age of Onset   Colon cancer Neg Hx    Social History   Tobacco Use   Smoking status: Every Day  Packs/day: 0.50    Years: 20.00    Pack years: 10.00    Types: Cigarettes   Smokeless tobacco: Never  Vaping Use   Vaping Use: Never used  Substance Use Topics   Alcohol use: No   Drug use: No    ROS:  General: Negative for anorexia, weight loss, fever, chills, fatigue, weakness. ENT: Negative for hoarseness, difficulty swallowing , nasal congestion. CV: Negative for chest pain, angina, palpitations, dyspnea on exertion, peripheral edema.  Respiratory: Negative for dyspnea at rest, dyspnea on exertion, cough, sputum,  wheezing.  GI: See history of present illness. GU:  Negative for dysuria, hematuria, urinary incontinence, urinary frequency, nocturnal urination.  Endo: Negative for unusual weight change.    Physical Examination:   BP 122/71   Pulse 75   Temp (!) 97.1 F (36.2 C) (Temporal)   Ht '5\' 1"'$  (1.549 m)   Wt 102 lb 3.2 oz (46.4 kg)   BMI 19.31 kg/m   General: Well-nourished, well-developed in no acute distress.  Eyes: No icterus. Mouth: masked Lungs: Clear to auscultation bilaterally.  Heart: Regular rate and rhythm, no murmurs rubs or gallops.  Abdomen: Bowel sounds are normal, nontender, nondistended, no hepatosplenomegaly or masses, no abdominal bruits or hernia , no rebound or guarding.   Extremities: No lower extremity edema. No clubbing or deformities. Neuro: Alert and oriented x 4   Skin: Warm and dry, no jaundice.   Psych: Alert and cooperative, normal mood and affect.    Imaging Studies: No results found.   Assessment:  Pleasant 65 year old female presenting for follow-up of GERD and gastric ulcers.  EGD in January 2022.  She had esophagus dissecans, 2 gastric ulcers which were nonbleeding.  Esophageal biopsy with no evidence of HSV/CMV/fungal elements.  No malignancy.  Gastric biopsies negative for H. pylori.  She was advised to stop Excedrin Migraine and Celebrex and pursue 57-monthsurveillance EGD.  Clinically doing better.  She continues to occasionally use Excedrin Migraine but no longer on Celebrex.  She has been on PPI twice daily.  She postponed her surveillance EGD back in April due to other family issues but is ready to pursue at this time.   Plan: Continue pantoprazole 40 mg twice daily until endoscopy.  Based on findings we may reduce you to once daily dosing. Would recommend avoiding aspirin powder and NSAID use. Upper endoscopy with Dr. RGala Romneyin the near future. ASA II.  I have discussed the risks, alternatives, benefits with regards to but not limited to the  risk of reaction to medication, bleeding, infection, perforation and the patient is agreeable to proceed. Written consent to be obtained.

## 2021-04-13 NOTE — Progress Notes (Signed)
Primary Care Physician: Jerel Shepherd, FNP  Primary Gastroenterologist:  Garfield Cornea, MD   Chief Complaint  Patient presents with   Gastroesophageal Reflux     To discuss another endoscopy     HPI: Joan Collins is a 65 y.o. female here for follow-up.  Patient last seen in April.  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 pylori, felt to be NSAID and/or aspirin related.  Repeat EGD recommended in 3 months.  Pantoprazole was increased to twice daily.  She had been on Celebrex for arthritis and Excedrin Migraine for headache.  Took off celebrex. Topical regimens and Tramadol not helping arthritis pain. States she continues to take occasional Excedrin for migraines.  She complains of epigastric pressure when she lays down at night.  Also notes that if she eats the wrong things.  For the most part no abdominal pain.  No melena, rectal bleeding, dysphagia.  No heartburn.  Bowel movements regular.  Current Outpatient Medications  Medication Sig Dispense Refill   ASPERCREME LIDOCAINE EX Apply 1 application topically 4 (four) times daily as needed (hip pain).     aspirin-acetaminophen-caffeine (EXCEDRIN MIGRAINE) 250-250-65 MG tablet Take 2 tablets by mouth 2 (two) times daily as needed for migraine.     atorvastatin (LIPITOR) 80 MG tablet Take 80 mg by mouth daily.     Boswellia-Glucosamine-Vit D (OSTEO BI-FLEX ONE PER DAY PO) Take 1 tablet by mouth in the morning.     Cholecalciferol (VITAMIN D3) 125 MCG (5000 UT) TABS Take 5,000 Units by mouth daily.     ELDERBERRY PO Take 3,200 mg by mouth in the morning.     hydrochlorothiazide (HYDRODIURIL) 25 MG tablet Take 1 tablet (25 mg total) by mouth daily. 30 tablet 0   pantoprazole (PROTONIX) 40 MG tablet Take 40 mg by mouth in the morning and at bedtime.     traMADol  (ULTRAM) 50 MG tablet Take 50 mg by mouth at bedtime as needed (pain.).     Turmeric 500 MG TABS Take 500 mg by mouth in the morning.     vitamin B-12 (CYANOCOBALAMIN) 1000 MCG tablet Take 1,000 mcg by mouth in the morning.     No current facility-administered medications for this visit.    Allergies as of 04/13/2021   (No Known Allergies)   Past Medical History:  Diagnosis Date   Arthritis    GERD (gastroesophageal reflux disease)    Headache(784.0)    Migraines   Hypertension    PONV (postoperative nausea and vomiting)    Past Surgical History:  Procedure Laterality Date   BIOPSY  09/15/2020   Procedure: BIOPSY;  Surgeon: Daneil Dolin, MD;  Location: [facility];  Service: Endoscopy;;   CARPAL TUNNEL RELEASE Right    COLONOSCOPY N/A 05/04/2014   Dr. Gala Romney: Diverticulosis, single polyp removed, no adenomatous changes.  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  Nonbleeding gastric ulcers likely NSAID related/celebrex/excedrin (biopsy with ulcer with reactive changes, no malignancy, no H. pylori).   TUBAL LIGATION     Family History  Problem Relation Age of Onset   Colon cancer Neg Hx    Social History   Tobacco Use   Smoking status: Every Day  Packs/day: 0.50    Years: 20.00    Pack years: 10.00    Types: Cigarettes   Smokeless tobacco: Never  Vaping Use   Vaping Use: Never used  Substance Use Topics   Alcohol use: No   Drug use: No    ROS:  General: Negative for anorexia, weight loss, fever, chills, fatigue, weakness. ENT: Negative for hoarseness, difficulty swallowing , nasal congestion. CV: Negative for chest pain, angina, palpitations, dyspnea on exertion, peripheral edema.  Respiratory: Negative for dyspnea at rest, dyspnea on exertion, cough, sputum,  wheezing.  GI: See history of present illness. GU:  Negative for dysuria, hematuria, urinary incontinence, urinary frequency, nocturnal urination.  Endo: Negative for unusual weight change.    Physical Examination:   BP 122/71   Pulse 75   Temp (!) 97.1 F (36.2 C) (Temporal)   Ht '5\' 1"'$  (1.549 m)   Wt 102 lb 3.2 oz (46.4 kg)   BMI 19.31 kg/m   General: Well-nourished, well-developed in no acute distress.  Eyes: No icterus. Mouth: masked Lungs: Clear to auscultation bilaterally.  Heart: Regular rate and rhythm, no murmurs rubs or gallops.  Abdomen: Bowel sounds are normal, nontender, nondistended, no hepatosplenomegaly or masses, no abdominal bruits or hernia , no rebound or guarding.   Extremities: No lower extremity edema. No clubbing or deformities. Neuro: Alert and oriented x 4   Skin: Warm and dry, no jaundice.   Psych: Alert and cooperative, normal mood and affect.    Imaging Studies: No results found.   Assessment:  Pleasant 65 year old female presenting for follow-up of GERD and gastric ulcers.  EGD in January 2022.  She had esophagus dissecans, 2 gastric ulcers which were nonbleeding.  Esophageal biopsy with no evidence of HSV/CMV/fungal elements.  No malignancy.  Gastric biopsies negative for H. pylori.  She was advised to stop Excedrin Migraine and Celebrex and pursue 32-monthsurveillance EGD.  Clinically doing better.  She continues to occasionally use Excedrin Migraine but no longer on Celebrex.  She has been on PPI twice daily.  She postponed her surveillance EGD back in April due to other family issues but is ready to pursue at this time.   Plan: Continue pantoprazole 40 mg twice daily until endoscopy.  Based on findings we may reduce you to once daily dosing. Would recommend avoiding aspirin powder and NSAID use. Upper endoscopy with Dr. RGala Romneyin the near future. ASA II.  I have discussed the risks, alternatives, benefits with regards to but not limited to the  risk of reaction to medication, bleeding, infection, perforation and the patient is agreeable to proceed. Written consent to be obtained.

## 2021-04-13 NOTE — Patient Instructions (Addendum)
Continue pantoprazole 40 mg twice daily for now.  We will likely reduce you to once daily dosing after your upcoming endoscopy. Upper endoscopy as scheduled.  Please see separate instructions.

## 2021-04-20 ENCOUNTER — Other Ambulatory Visit: Payer: Self-pay

## 2021-04-20 ENCOUNTER — Ambulatory Visit (HOSPITAL_COMMUNITY)
Admission: RE | Admit: 2021-04-20 | Discharge: 2021-04-20 | Disposition: A | Discharge status: Home / Self Care | Payer: BLUE CROSS/BLUE SHIELD | Attending: Internal Medicine | Admitting: Internal Medicine

## 2021-04-20 ENCOUNTER — Encounter (HOSPITAL_COMMUNITY)
Admission: RE | Disposition: A | Discharge status: Home / Self Care | Payer: Self-pay | Source: Home / Self Care | Attending: Internal Medicine

## 2021-04-20 ENCOUNTER — Encounter (HOSPITAL_COMMUNITY): Payer: Self-pay | Admitting: Internal Medicine

## 2021-04-20 DIAGNOSIS — G43909 Migraine, unspecified, not intractable, without status migrainosus: Secondary | ICD-10-CM | POA: Insufficient documentation

## 2021-04-20 DIAGNOSIS — Z7982 Long term (current) use of aspirin: Secondary | ICD-10-CM | POA: Insufficient documentation

## 2021-04-20 DIAGNOSIS — Z09 Encounter for follow-up examination after completed treatment for conditions other than malignant neoplasm: Secondary | ICD-10-CM | POA: Insufficient documentation

## 2021-04-20 DIAGNOSIS — Z79899 Other long term (current) drug therapy: Secondary | ICD-10-CM | POA: Insufficient documentation

## 2021-04-20 DIAGNOSIS — F17210 Nicotine dependence, cigarettes, uncomplicated: Secondary | ICD-10-CM | POA: Insufficient documentation

## 2021-04-20 DIAGNOSIS — Z8711 Personal history of peptic ulcer disease: Secondary | ICD-10-CM | POA: Insufficient documentation

## 2021-04-20 DIAGNOSIS — K259 Gastric ulcer, unspecified as acute or chronic, without hemorrhage or perforation: Secondary | ICD-10-CM | POA: Insufficient documentation

## 2021-04-20 DIAGNOSIS — K279 Peptic ulcer, site unspecified, unspecified as acute or chronic, without hemorrhage or perforation: Secondary | ICD-10-CM

## 2021-04-20 DIAGNOSIS — M1990 Unspecified osteoarthritis, unspecified site: Secondary | ICD-10-CM | POA: Insufficient documentation

## 2021-04-20 HISTORY — PX: ESOPHAGOGASTRODUODENOSCOPY: SHX5428

## 2021-04-20 SURGERY — EGD (ESOPHAGOGASTRODUODENOSCOPY)
Anesthesia: Moderate Sedation

## 2021-04-20 MED ORDER — MIDAZOLAM HCL 5 MG/5ML IJ SOLN
INTRAMUSCULAR | Status: DC | PRN
  Administered 2021-04-20: 2 mg via INTRAVENOUS
  Administered 2021-04-20: 1 mg via INTRAVENOUS

## 2021-04-20 MED ORDER — SODIUM CHLORIDE 0.9 % IV SOLN: INTRAVENOUS | Status: DC

## 2021-04-20 MED ORDER — MEPERIDINE HCL 100 MG/ML IJ SOLN
INTRAMUSCULAR | Status: DC | PRN
  Administered 2021-04-20: 15 mg via INTRAVENOUS
  Administered 2021-04-20: 25 mg via INTRAVENOUS

## 2021-04-20 MED ORDER — MEPERIDINE HCL 50 MG/ML IJ SOLN
INTRAMUSCULAR | Status: AC
  Filled 2021-04-20: qty 1

## 2021-04-20 MED ORDER — MIDAZOLAM HCL 5 MG/5ML IJ SOLN
INTRAMUSCULAR | Status: AC
  Filled 2021-04-20: qty 10

## 2021-04-20 MED ORDER — ONDANSETRON HCL 4 MG/2ML IJ SOLN
INTRAMUSCULAR | Status: AC
  Filled 2021-04-20: qty 2

## 2021-04-20 MED ORDER — ONDANSETRON HCL 4 MG/2ML IJ SOLN
INTRAMUSCULAR | Status: DC | PRN
  Administered 2021-04-20: 4 mg via INTRAVENOUS

## 2021-04-20 MED ORDER — LIDOCAINE VISCOUS HCL 2 % MT SOLN
OROMUCOSAL | Status: DC | PRN
  Administered 2021-04-20: 4 mL via OROMUCOSAL

## 2021-04-20 MED ORDER — STERILE WATER FOR IRRIGATION IR SOLN
Status: DC | PRN
  Administered 2021-04-20: 100 mL

## 2021-04-20 MED ORDER — LIDOCAINE VISCOUS HCL 2 % MT SOLN
OROMUCOSAL | Status: AC
  Filled 2021-04-20: qty 15

## 2021-04-20 NOTE — Interval H&P Note (Signed)
History and Physical Interval Note:  04/20/2021 11:20 AM  Joan Collins  has presented today for surgery, with the diagnosis of PUD, esophagitis.  The various methods of treatment have been discussed with the patient and family. After consideration of risks, benefits and other options for treatment, the patient has consented to  Procedure(s) with comments: ESOPHAGOGASTRODUODENOSCOPY (EGD) (N/A) - 11:15am as a surgical intervention.  The patient's history has been reviewed, patient examined, no change in status, stable for surgery.  I have reviewed the patient's chart and labs.  Questions were answered to the patient's satisfaction.     Manus Rudd

## 2021-04-20 NOTE — Interval H&P Note (Signed)
History and Physical Interval Note:  04/20/2021 11:27 AM  Joan Collins  has presented today for surgery, with the diagnosis of PUD, esophagitis.  The various methods of treatment have been discussed with the patient and family. After consideration of risks, benefits and other options for treatment, the patient has consented to  Procedure(s) with comments: ESOPHAGOGASTRODUODENOSCOPY (EGD) (N/A) - 11:15am as a surgical intervention.  The patient's history has been reviewed, patient examined, no change in status, stable for surgery.  I have reviewed the patient's chart and labs.  Questions were answered to the patient's satisfaction.     Joan Collins  No change.  Patient here for surveillance EGD.  Still uses Excedrin / aspirin products..  No dysphagia.  Surveillance EGD today per plan.  The risks, benefits, limitations, alternatives and imponderables have been reviewed with the patient. Potential for esophageal dilation, biopsy, etc. have also been reviewed.  Questions have been answered. All parties agreeable.

## 2021-04-20 NOTE — Op Note (Signed)
[facility] Patient Name: Joan Collins Procedure Date: 04/20/2021 11:04 AM MRN: [id number] Date of Birth: 1955-08-24 Attending MD: Norvel Richards , MD CSN: [id number] Age: 65 Admit Type: Outpatient Procedure:                Upper GI endoscopy Indications:              Peptic ulcer -surveillance; continued ASA use. Providers:                Norvel Richards, MD, Rosina Lowenstein, RN, Raphael Gibney, Technician Referring MD:              Medicines:                Midazolam 3 mg IV, Meperidine 40 mg IV Complications:            No immediate complications. Estimated Blood Loss:     Estimated blood loss: none. Procedure:                Pre-Anesthesia Assessment:                           - Prior to the procedure, a History and Physical                            was performed, and patient medications and                            allergies were reviewed. The patient's tolerance of                            previous anesthesia was also reviewed. The risks                            and benefits of the procedure and the sedation                            options and risks were discussed with the patient.                            All questions were answered, and informed consent                            was obtained. Prior Anticoagulants: The patient has                            taken no previous anticoagulant or antiplatelet                            agents. ASA Grade Assessment: III - A patient with                            severe systemic disease. After reviewing the risks  and benefits, the patient was deemed in                            satisfactory condition to undergo the procedure.                           After obtaining informed consent, the endoscope was                            passed under direct vision. Throughout the                            procedure, the patient's blood pressure, pulse, and                             oxygen saturations were monitored continuously. The                            [id number]-[id number] scope was introduced through the                            mouth, and advanced to the second part of duodenum.                            The upper GI endoscopy was accomplished without                            difficulty. The patient tolerated the procedure                            well. Scope In: 11:35:14 AM Scope Out: 11:37:05 AM Total Procedure Duration: 0 hours 1 minute 51 seconds  Findings:      The examined esophagus was normal.      Some scar through the pyloric channel previously noted ulcer completely       healed; otherwise normal stomach.. Bulbar erosions present; otherwise       the bulb and second portion appeared normal Impression:               - Normal esophagus. Pyloric channel scar with                            bulbar erosions; previously noted gastric ulcer has                            completely healed                           - No specimens collected. Moderate Sedation:      Moderate (conscious) sedation was administered by the endoscopy nurse       and supervised by the endoscopist. The following parameters were       monitored: oxygen saturation, heart rate, blood pressure, respiratory       rate, EKG, adequacy of pulmonary ventilation, and response to care.       Total physician intraservice time was 8 minutes. Recommendation:           -  Patient has a contact number available for                            emergencies. The signs and symptoms of potential                            delayed complications were discussed with the                            patient. Return to normal activities tomorrow.                            Written discharge instructions were provided to the                            patient.                           - Advance diet as tolerated.                           - Continue present medications.                            - Return to my office in 6 months. Continue                            Protonix 40 mg twice daily. Please minimize,                            ideally avoid, all ASA/NSAID products. Procedure Code(s):        --- Professional ---                           [phone], Esophagogastroduodenoscopy, flexible,                            transoral; diagnostic, including collection of                            specimen(s) by brushing or washing, when performed                            (separate procedure) Diagnosis Code(s):        --- Professional ---                           K27.9, Peptic ulcer, site unspecified, unspecified                            as acute or chronic, without hemorrhage or                            perforation CPT copyright 2019 [employer]. All rights reserved. The codes documented in this report are preliminary and upon coder review may  be revised to  meet current compliance requirements. Cristopher Estimable. Lazariah Savard, MD Norvel Richards, MD 04/20/2021 11:50:58 AM This report has been signed electronically. Number of Addenda: 0

## 2021-04-20 NOTE — Discharge Instructions (Signed)
EGD Discharge instructions Please read the instructions outlined below and refer to this sheet in the next few weeks. These discharge instructions provide you with general information on caring for yourself after you leave the hospital. Your doctor may also give you specific instructions. While your treatment has been planned according to the most current medical practices available, unavoidable complications occasionally occur. If you have any problems or questions after discharge, please call your doctor. ACTIVITY You may resume your regular activity but move at a slower pace for the next 24 hours.  Take frequent rest periods for the next 24 hours.  Walking will help expel (get rid of) the air and reduce the bloated feeling in your abdomen.  No driving for 24 hours (because of the anesthesia (medicine) used during the test).  You may shower.  Do not sign any important legal documents or operate any machinery for 24 hours (because of the anesthesia used during the test).  NUTRITION Drink plenty of fluids.  You may resume your normal diet.  Begin with a light meal and progress to your normal diet.  Avoid alcoholic beverages for 24 hours or as instructed by your caregiver.  MEDICATIONS You may resume your normal medications unless your caregiver tells you otherwise.  WHAT YOU CAN EXPECT TODAY You may experience abdominal discomfort such as a feeling of fullness or "gas" pains.  FOLLOW-UP Your doctor will discuss the results of your test with you.  SEEK IMMEDIATE MEDICAL ATTENTION IF ANY OF THE FOLLOWING OCCUR: Excessive nausea (feeling sick to your stomach) and/or vomiting.  Severe abdominal pain and distention (swelling).  Trouble swallowing.  Temperature over 101 F (37.8 C).  Rectal bleeding or vomiting of blood.    Previously noted ulcer has healed.  Would be best to avoid aspirin/NSAID products entirely  For now, continue Protonix 40 mg twice daily  Office visit with [place] in 6  months  At patient request, I called [facility] light at [phone] and reviewed findings and recommendations

## 2021-04-29 ENCOUNTER — Encounter (HOSPITAL_COMMUNITY): Payer: Self-pay | Admitting: Internal Medicine

## 2021-09-12 ENCOUNTER — Ambulatory Visit: Payer: Self-pay | Admitting: Gastroenterology

## 2021-09-12 ENCOUNTER — Encounter: Payer: Self-pay | Admitting: Gastroenterology

## 2021-09-12 ENCOUNTER — Other Ambulatory Visit: Payer: Self-pay

## 2021-09-12 VITALS — BP 123/76 | HR 91 | Temp 97.3°F | Ht 61.0 in | Wt 100.4 lb

## 2021-09-12 DIAGNOSIS — R1900 Intra-abdominal and pelvic swelling, mass and lump, unspecified site: Secondary | ICD-10-CM | POA: Insufficient documentation

## 2021-09-12 DIAGNOSIS — K219 Gastro-esophageal reflux disease without esophagitis: Secondary | ICD-10-CM

## 2021-09-12 MED ORDER — PANTOPRAZOLE SODIUM 40 MG PO TBEC: 40.0000 mg | DELAYED_RELEASE_TABLET | Freq: Two times a day (BID) | ORAL | 3 refills | Status: AC

## 2021-09-12 NOTE — Patient Instructions (Signed)
Continue pantoprazole 40mg  twice daily before a meal.  You have a small lump in the upper abdomen that was felt today. I'm not sure if this is a hernia containing fat, a lipoma, or something more significant. A CT scan is needed to sort this out. Let me know when you are ready to have this done. We will hold off now per your request, due to waiting for your insurance to be straightened out. If you notice increased pain, increase in size of the lump, please let me know.  We will see you back in 03/2022.  Your next colonoscopy is due in 2025.

## 2021-09-12 NOTE — Progress Notes (Signed)
Primary Care Physician: Jerel Shepherd, FNP  Primary Gastroenterologist:  Garfield Cornea, MD   Chief Complaint  Patient presents with   Gastroesophageal Reflux    Pp f/u; occ flare up but usually related to what she has ate    HPI: Joan Collins is a 66 y.o. female here for follow-up.  Patient last seen in the office in August 2022.  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 pylori, felt to be NSAID or aspirin related.  Pantoprazole was increased to twice daily.  Celebrex was stopped.  She continued to take Excedrin occasionally for migraines.  EGD completed in August 2022 showing normal esophagus and previous gastric ulcer completely healed.  Advised to continue pantoprazole twice daily.  Patient presents today for follow-up.  Overall her reflux is well controlled.  Occasional flare related to certain trigger foods.  Denies any dysphagia, vomiting.  Has some mild epigastric pressure, notes it more when she lays down at night.  This has been going on for quite some time.  Noted prior to last EGD.  She states that her insurance got messed up when she cut back at work.  Has Medicare part a only.  Trying to get this sorted out but anticipates no change until June.  Trying to hold off on any labs or unnecessary medical work-up.  Being seen at [place].  Biggest problem is her back/hip pain. Tramadol did not help so she stopped it. Unable to take NSAIDs due to ulcers.    EGD August 2022: - Normal esophagus. Pyloric channel scar with bulbar erosions; previously noted gastric ulcer has completely healed - No specimens collected.  Current Outpatient Medications  Medication Sig Dispense Refill   ASPERCREME LIDOCAINE EX Apply 1 application topically 4 (four) times daily as needed (hip pain).      atorvastatin (LIPITOR) 80 MG tablet Take 80 mg by mouth daily.     Boswellia-Glucosamine-Vit D (OSTEO BI-FLEX ONE PER DAY PO) Take 1 tablet by mouth in the morning.     Cholecalciferol (VITAMIN D3) 125 MCG (5000 UT) TABS Take 5,000 Units by mouth daily.     ELDERBERRY PO Take 3,200 mg by mouth in the morning.     hydrochlorothiazide (HYDRODIURIL) 25 MG tablet Take 1 tablet (25 mg total) by mouth daily. 30 tablet 0   pantoprazole (PROTONIX) 40 MG tablet Take 40 mg by mouth in the morning and at bedtime.     Turmeric 500 MG TABS Take 500 mg by mouth in the morning.     vitamin B-12 (CYANOCOBALAMIN) 1000 MCG tablet Take 1,000 mcg by mouth in the morning.     No current facility-administered medications for this visit.    Allergies as of 09/12/2021   (No Known Allergies)    ROS:  General: Negative for anorexia, weight loss, fever, chills, fatigue, weakness. ENT: Negative for hoarseness, difficulty swallowing , nasal congestion. CV: Negative for chest pain, angina, palpitations, dyspnea on exertion, peripheral edema.  Respiratory: Negative for dyspnea at rest, dyspnea on exertion, cough, sputum, wheezing.  GI: See history of present illness. GU:  Negative for dysuria, hematuria, urinary incontinence, urinary frequency, nocturnal urination.  Endo: Negative for unusual weight change.    Physical Examination:   BP 123/76    Pulse 91    Temp (!) 97.3 F (36.3 C) (Temporal)  Ht 5\' 1"  (1.549 m)    Wt 100 lb 6.4 oz (45.5 kg)    BMI 18.97 kg/m   General: Well-nourished, well-developed in no acute distress.  Eyes: No icterus. Mouth: masked Lungs: Clear to auscultation bilaterally.  Heart: Regular rate and rhythm, no murmurs rubs or gallops.  Abdomen: Bowel sounds are normal,  nondistended, no hepatosplenomegaly or masses, no abdominal bruits or hernia , no rebound or guarding.  Nickle size round mass noted in midline between substernal region and umbilicus. Feels superficial but could not  lift with abdominal wall like lipoma. Could not reduce like hernia.  Extremities: No lower extremity edema. No clubbing or deformities. Neuro: Alert and oriented x 4   Skin: Warm and dry, no jaundice.   Psych: Alert and cooperative, normal mood and affect.   Imaging Studies: No results found.   Assessment/Plan:  GERD/gastric ulcers: Clinically doing well on pantoprazole twice daily.  Has been advised to continue twice daily dosing.  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  Reinforced antireflux measures.  Recommend avoiding NSAIDs.  Colon cancer screening: Due in 2025.  Abdominal mass: Nickel sized, not noted on prior exams although patient feels like it has been many years.  Tender to palpation.  No recent abdominal imaging.  May be lipoma, hernia containing fat but cannot rule out other significant findings.  Offered CT abdomen pelvis with contrast to evaluate but patient would like to postpone right now because of insurance issues.  We discussed potential possibility of patient assistance if needed.  She wants to monitor the area, will call if worsening pain or increase in size but otherwise she would like to wait until her insurance has been straightened out and she anticipates this over the summertime.  She request to come back and see me in August.

## 2022-04-10 ENCOUNTER — Ambulatory Visit: Payer: Self-pay | Admitting: Gastroenterology

## 2022-04-10 NOTE — Progress Notes (Deleted)
GI Office Note    Referring Provider: Jerel Shepherd, [place] Primary Care Physician:  Jerel Shepherd, [place]  Primary Gastroenterologist: Garfield Cornea, MD   Chief Complaint   No chief complaint on file.   History of Present Illness   Joan Collins is a 66 y.o. female presenting today for follow-up.  Last seen in January 2023.  She has a history of GERD/gastric ulcers, abdominal mass.   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 pylori, felt to be NSAID or aspirin related.  Pantoprazole was increased to twice daily.  Celebrex was stopped.  She continued to take Excedrin occasionally for migraines.  EGD completed in August 2022 showing normal esophagus and previous gastric ulcer completely healed.  Advised to continue pantoprazole twice daily.     Medications   Current Outpatient Medications  Medication Sig Dispense Refill   ASPERCREME LIDOCAINE EX Apply 1 application topically 4 (four) times daily as needed (hip pain).     atorvastatin (LIPITOR) 80 MG tablet Take 80 mg by mouth daily.     Boswellia-Glucosamine-Vit D (OSTEO BI-FLEX ONE PER DAY PO) Take 1 tablet by mouth in the morning.     Cholecalciferol (VITAMIN D3) 125 MCG (5000 UT) TABS Take 5,000 Units by mouth daily.     ELDERBERRY PO Take 3,200 mg by mouth in the morning.     hydrochlorothiazide (HYDRODIURIL) 25 MG tablet Take 1 tablet (25 mg total) by mouth daily. 30 tablet 0   pantoprazole (PROTONIX) 40 MG tablet Take 1 tablet (40 mg total) by mouth 2 (two) times daily before a meal. 180 tablet 3   Turmeric 500 MG TABS Take 500 mg by mouth in the morning.     vitamin B-12 (CYANOCOBALAMIN) 1000 MCG tablet Take 1,000 mcg by mouth in the morning.     No current facility-administered medications for this visit.    Allergies   Allergies as of  04/10/2022   (No Known Allergies)     Past Medical History   Past Medical History:  Diagnosis Date   Arthritis    GERD (gastroesophageal reflux disease)    Headache(784.0)    Migraines   Hypertension    PONV (postoperative nausea and vomiting)     Past Surgical History   Past Surgical History:  Procedure Laterality Date   BIOPSY  09/15/2020   Procedure: BIOPSY;  Surgeon: Daneil Dolin, MD;  [facility];  Service: Endoscopy;;   CARPAL TUNNEL RELEASE Right    COLONOSCOPY N/A 05/04/2014   Dr. Gala Romney: Diverticulosis, single polyp removed, no adenomatous changes.  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  Nonbleeding gastric ulcers likely NSAID related/celebrex/excedrin (biopsy with ulcer with reactive changes, no malignancy, no H. pylori).   ESOPHAGOGASTRODUODENOSCOPY N/A 04/20/2021   Procedure: ESOPHAGOGASTRODUODENOSCOPY (EGD);  Surgeon: Daneil Dolin, MD;  Location: [facility];  Service: Endoscopy;  Laterality: N/A;  11:15am   TUBAL LIGATION      Past Family History   Family History  Problem Relation Age of Onset   Colon cancer Neg Hx     Past Social History   Social History   Socioeconomic History   Marital status: Widowed  Spouse name: Not on file   Number of children: Not on file   Years of education: Not on file   Highest education level: Not on file  Occupational History   Not on file  Tobacco Use   Smoking status: Every Day    Packs/day: 0.50    Years: 20.00    Total pack years: 10.00    Types: Cigarettes   Smokeless tobacco: Never  Vaping Use   Vaping Use: Never used  Substance and Sexual Activity   Alcohol use: No   Drug use: No   Sexual activity: Not on file  Other Topics Concern   Not on file  Social History Narrative   Not on file    Social Determinants of Health   Financial Resource Strain: Not on file  Food Insecurity: Not on file  Transportation Needs: Not on file  Physical Activity: Not on file  Stress: Not on file  Social Connections: Not on file  Intimate Partner Violence: Not on file    Review of Systems   General: Negative for anorexia, weight loss, fever, chills, fatigue, weakness. ENT: Negative for hoarseness, difficulty swallowing , nasal congestion. CV: Negative for chest pain, angina, palpitations, dyspnea on exertion, peripheral edema.  Respiratory: Negative for dyspnea at rest, dyspnea on exertion, cough, sputum, wheezing.  GI: See history of present illness. GU:  Negative for dysuria, hematuria, urinary incontinence, urinary frequency, nocturnal urination.  Endo: Negative for unusual weight change.     Physical Exam   There were no vitals taken for this visit.   General: Well-nourished, well-developed in no acute distress.  Eyes: No icterus. Mouth: Oropharyngeal mucosa moist and pink , no lesions erythema or exudate. Lungs: Clear to auscultation bilaterally.  Heart: Regular rate and rhythm, no murmurs rubs or gallops.  Abdomen: Bowel sounds are normal, nontender, nondistended, no hepatosplenomegaly or masses,  no abdominal bruits or hernia , no rebound or guarding.  Rectal: ***  Extremities: No lower extremity edema. No clubbing or deformities. Neuro: Alert and oriented x 4   Skin: Warm and dry, no jaundice.   Psych: Alert and cooperative, normal mood and affect.  Labs   *** Imaging Studies   No results found.  Assessment   GERD/gastric ulcers:  Colon cancer screening: Due in 2025.  Abdominal mass: Nickel-sized, not documented on prior exams although the patient reported it has been present for many years.  Tender to palpation.  Offered CT scan at last visit but she wanted to postpone because of insurance issues.  Agreed to monitor and call if worsening pain or increase in  size.   PLAN   ***   Laureen Ochs. Bobby Rumpf, [place], [facility]

## 2023-09-28 ENCOUNTER — Encounter: Payer: Self-pay | Admitting: Internal Medicine

## 2023-09-28 ENCOUNTER — Ambulatory Visit (INDEPENDENT_AMBULATORY_CARE_PROVIDER_SITE_OTHER): Payer: 59 | Admitting: Internal Medicine

## 2023-09-28 VITALS — BP 118/72 | HR 92 | Temp 98.1°F | Ht 61.0 in | Wt 85.8 lb

## 2023-09-28 DIAGNOSIS — R1310 Dysphagia, unspecified: Secondary | ICD-10-CM | POA: Diagnosis not present

## 2023-09-28 DIAGNOSIS — Z1211 Encounter for screening for malignant neoplasm of colon: Secondary | ICD-10-CM

## 2023-09-28 DIAGNOSIS — R1319 Other dysphagia: Secondary | ICD-10-CM

## 2023-09-28 DIAGNOSIS — Z8719 Personal history of other diseases of the digestive system: Secondary | ICD-10-CM

## 2023-09-28 DIAGNOSIS — K219 Gastro-esophageal reflux disease without esophagitis: Secondary | ICD-10-CM | POA: Diagnosis not present

## 2023-09-28 NOTE — Patient Instructions (Signed)
 It was nice to see you again today!  To evaluate your difficulty with food catching we will proceed with an EGD with possible esophageal dilation.  ASA 3.  Same time, we will perform an average risk screening colonoscopy.  Further recommendations to follow.

## 2023-09-28 NOTE — Progress Notes (Signed)
 Primary Care Physician:  Malachy Burnard Helling, FNP Primary Gastroenterologist:  Dr. Shaaron  Pre-Procedure History & Physical: HPI:  Joan  CAMILLE Collins is a 68 y.o. female here for scheduling of average risk screening colonoscopy.  Constipation managed with diet Diverticulosis found 10 years ago.  No family history colon cancer.  GERD trouble twice daily Protonix .  History of NSAID induced gastric ulcer disease previously H. pylori negative.  No NSAIDs now.  Does complain of food sticking in her retrosternal area. States she is always been a small framed individual.  Does not like to eat.  Her weight is down to 85 pounds was 102 in our office 3 years ago.  Nausea or vomiting no abdominal pain.  No melena or rectal bleeding. History of abdominal wall lesion for which CT scan was recommended years ago but patient did not follow through due to insurance issues.  Past Medical History:  Diagnosis Date   Arthritis    GERD (gastroesophageal reflux disease)    Headache(784.0)    Migraines   Hypertension    PONV (postoperative nausea and vomiting)     Past Surgical History:  Procedure Laterality Date   BIOPSY  09/15/2020   Procedure: BIOPSY;  Surgeon: Shaaron Lamar HERO, MD;  [facility];  Service: Endoscopy;;   CARPAL TUNNEL RELEASE Right    COLONOSCOPY N/A 05/04/2014   Dr. Shaaron: Diverticulosis, single polyp removed, no adenomatous changes.  Next colonoscopy in 2025   ESOPHAGOGASTRODUODENOSCOPY N/A 09/15/2020   Suzie Vandam: Inflamed appearing esophagus consistent with esophagus dissecans (esophageal biopsy showed ulceration with reactive changes, no HSV or CMV or fungal elements).  Nonbleeding gastric ulcers likely NSAID related/celebrex/excedrin (biopsy with ulcer with reactive changes, no malignancy, no H. pylori).   ESOPHAGOGASTRODUODENOSCOPY N/A 04/20/2021   Procedure: ESOPHAGOGASTRODUODENOSCOPY (EGD);  Surgeon: Shaaron Lamar HERO, MD;  [facility];  Service: Endoscopy;  Laterality:  N/A;  11:15am   TUBAL LIGATION      Prior to Admission medications   Medication Sig Start Date End Date Taking? Authorizing Provider  atorvastatin (LIPITOR) 80 MG tablet Take 80 mg by mouth daily. 02/03/19  Yes [provider]  hydrochlorothiazide  (HYDRODIURIL ) 12.5 MG tablet Take 12.5 mg by mouth daily. 09/24/23  Yes [provider]  mirtazapine (REMERON) 30 MG tablet Take 30 mg by mouth at bedtime. 09/24/23  Yes [provider]  Multiple Vitamin (MULTIVITAMIN) tablet Take 1 tablet by mouth daily.   Yes [provider]  pantoprazole  (PROTONIX ) 40 MG tablet Take 1 tablet (40 mg total) by mouth 2 (two) times daily before a meal. 09/12/21  Yes Ezzard Sonny RAMAN, PA-C  topiramate (TOPAMAX) 50 MG tablet Take 50 mg by mouth 2 (two) times daily. 09/27/23  Yes [provider]  vitamin B-12 (CYANOCOBALAMIN) 1000 MCG tablet Take 1,000 mcg by mouth in the morning.   Yes [provider]    Allergies as of 09/28/2023   (No Known Allergies)    Family History  Problem Relation Age of Onset   Colon cancer Neg Hx     Social History   Socioeconomic History   Marital status: Widowed    Spouse name: Not on file   Number of children: Not on file   Years of education: Not on file   Highest education level: Not on file  Occupational History   Not on file  Tobacco Use   Smoking status: Every Day    Current packs/day: 0.50    Average packs/day: 0.5 packs/day  for 20.0 years (10.0 ttl pk-yrs)    Types: Cigarettes   Smokeless tobacco: Never  Vaping Use   Vaping status: Never Used  Substance and Sexual Activity   Alcohol use: No   Drug use: No   Sexual activity: Not on file  Other Topics Concern   Not on file  Social History Narrative   Not on file   Social Drivers of Health   Financial Resource Strain: Not on file  Food Insecurity: Not on file  Transportation Needs: Not on file  Physical Activity: Not on file  Stress: Not on file  Social  Connections: Not on file  Intimate Partner Violence: Not on file    Review of Systems: See HPI, otherwise negative ROS  Physical Exam: BP 118/72 (BP Location: Right Arm, Patient Position: Sitting, Cuff Size: Normal)   Pulse 92   Temp 98.1 F (36.7 C) (Oral)   Ht 5' 1 (1.549 m)   Wt 85 lb 12.8 oz (38.9 kg)   SpO2 96%   BMI 16.21 kg/m  General:   In pleasant lady in no acute distress conversant  Lungs:  Clear throughout to auscultation.   No wheezes, crackles, or rhonchi. No acute distress. Heart:  Regular rate and rhythm; no murmurs, clicks, rubs,  or gallops. Abdomen: Non-distended, normal bowel sounds.  Soft and nontender.  Normal size firmness just superior to the umbilicus noted.  It was freely movable.  Nontender without appreciable mass or hepatosplenomegaly.   Impression/Plan: Thin 68 year old lady with longstanding reflux well-controlled on Protonix  but now has esophageal dysphagia.  History NSAID gastropathy peptic ulcer disease no H. pylori follow-up EGD documented healing.  No further NSAID use  History of diverticulosis on colonoscopy 10 years ago.  Due for average risk screening at this time.  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  Likely benign.  Recommendations:  To evaluate dysphagia further, offer patient EGD with esophageal dilation is feasible/appropriate.  At same time, we will perform a average risk screening colonoscopy.  Utilized pediatric colonoscope.  The risks, benefits, limitations, imponderables and alternatives regarding both EGD and colonoscopy have been reviewed with the patient. Questions have been answered. All parties agreeable.                    Notice: This dictation was prepared with Dragon dictation along with smaller phrase technology. Any transcriptional errors that result from this process are unintentional and may not be corrected upon review.

## 2023-10-03 ENCOUNTER — Telehealth: Payer: Self-pay | Admitting: *Deleted

## 2023-10-03 ENCOUNTER — Telehealth: Payer: Self-pay | Admitting: Internal Medicine

## 2023-10-03 NOTE — Telephone Encounter (Signed)
See previous TE

## 2023-10-03 NOTE — Telephone Encounter (Signed)
Pt returning call to schedule colonoscopy. [phone]

## 2023-10-03 NOTE — Telephone Encounter (Signed)
LMOVM to return call  TCS/EGD/ED w/Dr.Rourk, asa 3, pediatric scope

## 2023-10-03 NOTE — Telephone Encounter (Signed)
LMTRC

## 2023-10-15 ENCOUNTER — Encounter: Payer: Self-pay | Admitting: *Deleted

## 2023-10-15 ENCOUNTER — Other Ambulatory Visit: Payer: Self-pay | Admitting: *Deleted

## 2023-10-15 MED ORDER — PEG 3350-KCL-NA BICARB-NACL 420 G PO SOLR: 4000.0000 mL | Freq: Once | ORAL | 0 refills | Status: AC

## 2023-10-15 NOTE — Telephone Encounter (Signed)
 Pt has been scheduled for 12/12/23. Instructions mailed and prep sent to the pharmacy.

## 2023-10-15 NOTE — Telephone Encounter (Signed)
 LMOVM to return call.

## 2023-10-15 NOTE — Telephone Encounter (Signed)
 [employer] PA:  Notification or Prior Authorization is not required for the requested services You are not required to submit a notification/prior authorization based on the information provided. If you have general questions about the prior authorization requirements, visit [email or web address] > Clinician Resources > Advance and Admission Notification Requirements. The number above acknowledges your notification. Please write this reference number down for future reference. If you would like to request an organization determination, please call us at [phone]. Decision ID #: [id number]

## 2023-12-06 NOTE — Patient Instructions (Signed)
 [name]  12/06/2023     @PREFPERIOPPHARMACY @   Your procedure is scheduled on  4/23/225.   Report to [facility] at  1100 A.M.   Call this number if you have problems the morning of surgery:  [phone]  If you experience any cold or flu symptoms such as cough, fever, chills, shortness of breath, etc. between now and your scheduled surgery, please notify us at the above number.   Remember:  Follow the diet and prep instructions given to you by the office.    You may drink clear liquids until  0900 am on 12/12/2023.    Clear liquids allowed are:                    Water, Juice (No red color; non-citric and without pulp; diabetics please choose diet or no sugar options), Carbonated beverages (diabetics please choose diet or no sugar options), Clear Tea (No creamer, milk, or cream, including half & half and powdered creamer), Black Coffee Only (No creamer, milk or cream, including half & half and powdered creamer), Plain Jell-O Only (No red color; diabetics please choose no sugar options), Clear Sports drink (No red color; diabetics please choose diet or no sugar options), and Plain Popsicles Only (No red color; diabetics please choose no sugar options)    Take these medicines the morning of surgery with A SIP OF WATER                                        pantoprazole, topiramate.    Do not wear jewelry, make-up or nail polish, including gel polish,  artificial nails, or any other type of covering on natural nails (fingers and  toes).  Do not wear lotions, powders, or perfumes, or deodorant.  Do not shave 48 hours prior to surgery.  Men may shave face and neck.  Do not bring valuables to the hospital.  [facility] is not responsible for any belongings or valuables.  Contacts, dentures or bridgework may not be worn into surgery.  Leave your suitcase in the car.  After surgery it may be brought to your room.  For patients admitted to the hospital, discharge  time will be determined by your treatment team.  Patients discharged the day of surgery will not be allowed to drive home and must have someone with them for 24 hours.    Special instructions:   DO NOT smoke tobacco or vape for 24 hours before your procedure.  Please read over the following fact sheets that you were given. Anesthesia Post-op Instructions and Care and Recovery After Surgery      Upper Endoscopy, Adult, Care After After the procedure, it is common to have a sore throat. It is also common to have: Mild stomach pain or discomfort. Bloating. Nausea. Follow these instructions at home: The instructions below may help you care for yourself at home. Your health care provider may give you more instructions. If you have questions, ask your health care provider. If you were given a sedative during the procedure, it can affect you for several hours. Do not drive or operate machinery until your health care provider says that it is safe. If you will be going home right after the procedure, plan to have a responsible adult: Take you home from the hospital or clinic. You will not be  allowed to drive. Care for you for the time you are told. Follow instructions from your health care provider about what you may eat and drink. Return to your normal activities as told by your health care provider. Ask your health care provider what activities are safe for you. Take over-the-counter and prescription medicines only as told by your health care provider. Contact a health care provider if you: Have a sore throat that lasts longer than one day. Have trouble swallowing. Have a fever. Get help right away if you: Vomit blood or your vomit looks like coffee grounds. Have bloody, black, or tarry stools. Have a very bad sore throat or you cannot swallow. Have difficulty breathing or very bad pain in your chest or abdomen. These symptoms may be an emergency. Get help right away. Call 911. Do not  wait to see if the symptoms will go away. Do not drive yourself to the hospital. Summary After the procedure, it is common to have a sore throat, mild stomach discomfort, bloating, and nausea. If you were given a sedative during the procedure, it can affect you for several hours. Do not drive until your health care provider says that it is safe. Follow instructions from your health care provider about what you may eat and drink. Return to your normal activities as told by your health care provider. This information is not intended to replace advice given to you by your health care provider. Make sure you discuss any questions you have with your health care provider. Document Revised: 11/16/2021 Document Reviewed: 11/16/2021 Elsevier Patient Education  2024 [employer] Inc.Esophageal Dilatation Esophageal dilatation, or dilation, is done to stretch a blocked or narrowed part of your esophagus. The esophagus is the part of your body that moves food from your mouth to your stomach. You may need to have it stretched if: You have a lot of scar tissue and it makes it hard or painful to swallow. You have cancer of the esophagus. There's a problem with how food moves through your esophagus. In some cases, you may need to have this procedure done more than once. Tell a health care provider about: Any allergies you have. All medicines you're taking, including vitamins, herbs, eye drops, creams, and over-the-counter medicines. Any problems you or family members have had with anesthesia. Any bleeding problems you have. Any surgeries you've had. Any medical conditions you have. Whether you're pregnant or may be pregnant. What are the risks? Your health care provider will talk with you about risks. These may include: Bleeding. A hole or tear in your esophagus. What happens before the procedure? When to stop eating and drinking Follow instructions from your provider about what you may eat and drink.  These may include: 8 hours before your procedure Stop eating most foods. Do not eat meat, fried foods, or fatty foods. Eat only light foods, such as toast or crackers. All liquids are okay except energy drinks and alcohol. 6 hours before your procedure Stop eating. Drink only clear liquids, such as water, clear fruit juice, black coffee, plain tea, and sports drinks. Do not drink energy drinks or alcohol. 2 hours before your procedure Stop drinking all liquids. You may be allowed to take medicines with small sips of water. If you don't follow your provider's instructions, your procedure may be delayed or canceled. Medicines Ask your provider about: Changing or stopping your regular medicines. These include any diabetes medicines or blood thinners you take. Taking medicines such as aspirin and ibuprofen. These medicines  can thin your blood. Do not take them unless your provider tells you to. Taking over-the-counter medicines, vitamins, herbs, and supplements. General instructions If you'll be going home right after the procedure, plan to have a responsible adult: Take you home from the hospital or clinic. You won't be allowed to drive. Care for you for the time you're told. What happens during the procedure? You may be given: A sedative. This helps you relax. Anesthesia. This keeps you from feeling pain. It will numb certain areas of your body. The stretching may be done with: Simple dilators. These are tools put in your esophagus to stretch it. Guide wires. These wires are put in using a tube called an endoscope. A dilator is put over the wires to stretch your esophagus. Then the wires are taken out. A balloon. The balloon is on the end of a tube. It's inflated to stretch your esophagus. The procedure may vary among providers and hospitals. What can I expect after the procedure? Your blood pressure, heart rate, breathing rate, and blood oxygen level will be monitored until you leave  the hospital or clinic. Your throat may feel sore and numb. This will get better over time. You won't be allowed to eat or drink until your throat is no longer numb. You may be able to go home when you can: Drink. Pee. Sit on the edge of the bed without nausea or dizziness. Follow these instructions at home: Activity If you were given a sedative during the procedure, it can affect you for several hours. Do not drive or operate machinery until your provider says it's safe. Return to your normal activities as told by your provider. Ask your provider what activities are safe for you. General instructions Take over-the-counter and prescription medicines only as told by your provider. Follow instructions from your provider about what you may eat and drink. Do not use any products that contain nicotine or tobacco. These products include cigarettes, chewing tobacco, and vaping devices, such as e-cigarettes. If you need help quitting, ask your provider. Keep all follow-up visits. Your provider will make sure the procedure worked. Where to find more information American Society for Gastrointestinal Endoscopy (ASGE): asge.org Contact a health care provider if: You have trouble swallowing. You have a fever. Your pain doesn't get better with medicine. Get help right away if: You have chest pain. You have trouble breathing. You vomit blood. Your poop is: Black. Tarry. Bloody. These symptoms may be an emergency. Get help right away. Call 911. Do not wait to see if the symptoms will go away. Do not drive yourself to the hospital. This information is not intended to replace advice given to you by your health care provider. Make sure you discuss any questions you have with your health care provider. Document Revised: 11/03/2022 Document Reviewed: 11/03/2022 Elsevier Patient Education  2024 Elsevier Inc.Colonoscopy, Adult, Care After The following information offers guidance on how to care for  yourself after your procedure. Your health care provider may also give you more specific instructions. If you have problems or questions, contact your health care provider. What can I expect after the procedure? After the procedure, it is common to have: A small amount of blood in your stool for 24 hours after the procedure. Some gas. Mild cramping or bloating of your abdomen. Follow these instructions at home: Eating and drinking  Drink enough fluid to keep your urine pale yellow. Follow instructions from your health care provider about eating or drinking restrictions. Resume your  normal diet as told by your health care provider. Avoid heavy or fried foods that are hard to digest. Activity Rest as told by your health care provider. Avoid sitting for a long time without moving. Get up to take short walks every 1-2 hours. This is important to improve blood flow and breathing. Ask for help if you feel weak or unsteady. Return to your normal activities as told by your health care provider. Ask your health care provider what activities are safe for you. Managing cramping and bloating  Try walking around when you have cramps or feel bloated. If directed, apply heat to your abdomen as told by your health care provider. Use the heat source that your health care provider recommends, such as a moist heat pack or a heating pad. Place a towel between your skin and the heat source. Leave the heat on for 20-30 minutes. Remove the heat if your skin turns bright red. This is especially important if you are unable to feel pain, heat, or cold. You have a greater risk of getting burned. General instructions If you were given a sedative during the procedure, it can affect you for several hours. Do not drive or operate machinery until your health care provider says that it is safe. For the first 24 hours after the procedure: Do not sign important documents. Do not drink alcohol. Do your regular daily  activities at a slower pace than normal. Eat soft foods that are easy to digest. Take over-the-counter and prescription medicines only as told by your health care provider. Keep all follow-up visits. This is important. Contact a health care provider if: You have blood in your stool 2-3 days after the procedure. Get help right away if: You have more than a small spotting of blood in your stool. You have large blood clots in your stool. You have swelling of your abdomen. You have nausea or vomiting. You have a fever. You have increasing pain in your abdomen that is not relieved with medicine. These symptoms may be an emergency. Get help right away. Call 911. Do not wait to see if the symptoms will go away. Do not drive yourself to the hospital. Summary After the procedure, it is common to have a small amount of blood in your stool. You may also have mild cramping and bloating of your abdomen. If you were given a sedative during the procedure, it can affect you for several hours. Do not drive or operate machinery until your health care provider says that it is safe. Get help right away if you have a lot of blood in your stool, nausea or vomiting, a fever, or increased pain in your abdomen. This information is not intended to replace advice given to you by your health care provider. Make sure you discuss any questions you have with your health care provider. Document Revised: 09/19/2022 Document Reviewed: 03/30/2021 Elsevier Patient Education  2024 Elsevier Inc.General Anesthesia, Adult, Care After The following information offers guidance on how to care for yourself after your procedure. Your health care provider may also give you more specific instructions. If you have problems or questions, contact your health care provider. What can I expect after the procedure? After the procedure, it is common for people to: Have pain or discomfort at the IV site. Have nausea or vomiting. Have a sore  throat or hoarseness. Have trouble concentrating. Feel cold or chills. Feel weak, sleepy, or tired (fatigue). Have soreness and body aches. These can affect parts of the  body that were not involved in surgery. Follow these instructions at home: For the time period you were told by your health care provider:  Rest. Do not participate in activities where you could fall or become injured. Do not drive or use machinery. Do not drink alcohol. Do not take sleeping pills or medicines that cause drowsiness. Do not make important decisions or sign legal documents. Do not take care of children on your own. General instructions Drink enough fluid to keep your urine pale yellow. If you have sleep apnea, surgery and certain medicines can increase your risk for breathing problems. Follow instructions from your health care provider about wearing your sleep device: Anytime you are sleeping, including during daytime naps. While taking prescription pain medicines, sleeping medicines, or medicines that make you drowsy. Return to your normal activities as told by your health care provider. Ask your health care provider what activities are safe for you. Take over-the-counter and prescription medicines only as told by your health care provider. Do not use any products that contain nicotine or tobacco. These products include cigarettes, chewing tobacco, and vaping devices, such as e-cigarettes. These can delay incision healing after surgery. If you need help quitting, ask your health care provider. Contact a health care provider if: You have nausea or vomiting that does not get better with medicine. You vomit every time you eat or drink. You have pain that does not get better with medicine. You cannot urinate or have bloody urine. You develop a skin rash. You have a fever. Get help right away if: You have trouble breathing. You have chest pain. You vomit blood. These symptoms may be an emergency. Get help  right away. Call 911. Do not wait to see if the symptoms will go away. Do not drive yourself to the hospital. Summary After the procedure, it is common to have a sore throat, hoarseness, nausea, vomiting, or to feel weak, sleepy, or fatigue. For the time period you were told by your health care provider, do not drive or use machinery. Get help right away if you have difficulty breathing, have chest pain, or vomit blood. These symptoms may be an emergency. This information is not intended to replace advice given to you by your health care provider. Make sure you discuss any questions you have with your health care provider. Document Revised: 11/04/2021 Document Reviewed: 11/04/2021 Elsevier Patient Education  2024 [employer].

## 2023-12-10 ENCOUNTER — Other Ambulatory Visit: Payer: Self-pay

## 2023-12-10 ENCOUNTER — Encounter (HOSPITAL_COMMUNITY): Payer: Self-pay

## 2023-12-10 ENCOUNTER — Encounter (HOSPITAL_COMMUNITY)
Admission: RE | Admit: 2023-12-10 | Discharge: 2023-12-10 | Disposition: A | Discharge status: Home / Self Care | Payer: 59 | Source: Ambulatory Visit | Attending: Internal Medicine | Admitting: Internal Medicine

## 2023-12-10 VITALS — BP 124/77 | HR 91 | Temp 98.0°F | Resp 18 | Ht 61.0 in | Wt 85.8 lb

## 2023-12-10 DIAGNOSIS — Z79899 Other long term (current) drug therapy: Secondary | ICD-10-CM | POA: Diagnosis not present

## 2023-12-10 DIAGNOSIS — Z01818 Encounter for other preprocedural examination: Secondary | ICD-10-CM | POA: Diagnosis present

## 2023-12-10 DIAGNOSIS — I10 Essential (primary) hypertension: Secondary | ICD-10-CM | POA: Insufficient documentation

## 2023-12-10 LAB — BASIC METABOLIC PANEL WITH GFR
Anion gap: 11 (ref 5–15)
BUN: 15 mg/dL (ref 8–23)
CO2: 28 mmol/L (ref 22–32)
Calcium: 9.9 mg/dL (ref 8.9–10.3)
Chloride: 94 mmol/L — ABNORMAL LOW (ref 98–111)
Creatinine, Ser: 0.79 mg/dL (ref 0.44–1.00)
GFR, Estimated: >60 mL/min (ref >60)
Glucose, Bld: 171 mg/dL — ABNORMAL HIGH (ref 70–99)
Potassium: 2.6 mmol/L — CL (ref 3.5–5.1)
Sodium: 133 mmol/L — ABNORMAL LOW (ref 135–145)

## 2023-12-10 MED ORDER — POTASSIUM CHLORIDE CRYS ER 20 MEQ PO TBCR: 40.0000 meq | EXTENDED_RELEASE_TABLET | Freq: Two times a day (BID) | ORAL | 0 refills | Status: AC

## 2023-12-11 NOTE — Progress Notes (Signed)
 Low potassium 2.6.  Dr Riley Cheadle notified.  Orders for Potassium sent to pharmacy  and patient notified about prescription by Sheilda Deputy from the [facility] GI office.  Patient is to pick up prescription and start asap.

## 2023-12-11 NOTE — OR Nursing (Signed)
 Left message for patient to arrive at 0800 in the morning. Waiting to get confirmation.

## 2023-12-12 ENCOUNTER — Ambulatory Visit (HOSPITAL_COMMUNITY): Admitting: Certified Registered Nurse Anesthetist

## 2023-12-12 ENCOUNTER — Ambulatory Visit (HOSPITAL_COMMUNITY)
Admission: RE | Admit: 2023-12-12 | Discharge: 2023-12-12 | Disposition: A | Discharge status: Home / Self Care | Payer: 59 | Attending: Internal Medicine | Admitting: Internal Medicine

## 2023-12-12 ENCOUNTER — Encounter (HOSPITAL_COMMUNITY): Payer: Self-pay | Admitting: Internal Medicine

## 2023-12-12 ENCOUNTER — Encounter (HOSPITAL_COMMUNITY)
Admission: RE | Disposition: A | Discharge status: Home / Self Care | Payer: Self-pay | Source: Home / Self Care | Attending: Internal Medicine

## 2023-12-12 ENCOUNTER — Other Ambulatory Visit: Payer: Self-pay

## 2023-12-12 DIAGNOSIS — K449 Diaphragmatic hernia without obstruction or gangrene: Secondary | ICD-10-CM | POA: Diagnosis not present

## 2023-12-12 DIAGNOSIS — K229 Disease of esophagus, unspecified: Secondary | ICD-10-CM | POA: Diagnosis not present

## 2023-12-12 DIAGNOSIS — K319 Disease of stomach and duodenum, unspecified: Secondary | ICD-10-CM

## 2023-12-12 DIAGNOSIS — K259 Gastric ulcer, unspecified as acute or chronic, without hemorrhage or perforation: Secondary | ICD-10-CM

## 2023-12-12 DIAGNOSIS — K635 Polyp of colon: Secondary | ICD-10-CM

## 2023-12-12 DIAGNOSIS — R1310 Dysphagia, unspecified: Secondary | ICD-10-CM | POA: Diagnosis not present

## 2023-12-12 DIAGNOSIS — K52831 Collagenous colitis: Secondary | ICD-10-CM | POA: Insufficient documentation

## 2023-12-12 DIAGNOSIS — R1314 Dysphagia, pharyngoesophageal phase: Secondary | ICD-10-CM | POA: Insufficient documentation

## 2023-12-12 DIAGNOSIS — K219 Gastro-esophageal reflux disease without esophagitis: Secondary | ICD-10-CM | POA: Insufficient documentation

## 2023-12-12 DIAGNOSIS — K5730 Diverticulosis of large intestine without perforation or abscess without bleeding: Secondary | ICD-10-CM | POA: Diagnosis not present

## 2023-12-12 DIAGNOSIS — F17210 Nicotine dependence, cigarettes, uncomplicated: Secondary | ICD-10-CM | POA: Insufficient documentation

## 2023-12-12 DIAGNOSIS — Z1211 Encounter for screening for malignant neoplasm of colon: Secondary | ICD-10-CM | POA: Diagnosis present

## 2023-12-12 DIAGNOSIS — I10 Essential (primary) hypertension: Secondary | ICD-10-CM | POA: Diagnosis not present

## 2023-12-12 LAB — POCT I-STAT, CHEM 8
BUN: 9 mg/dL (ref 8–23)
Calcium, Ion: 1.13 mmol/L — ABNORMAL LOW (ref 1.15–1.40)
Chloride: 98 mmol/L (ref 98–111)
Creatinine, Ser: 0.8 mg/dL (ref 0.44–1.00)
Glucose, Bld: 87 mg/dL (ref 70–99)
HCT: 43 % (ref 36.0–46.0)
Hemoglobin: 14.6 g/dL (ref 12.0–15.0)
Potassium: 3.5 mmol/L (ref 3.5–5.1)
Sodium: 135 mmol/L (ref 135–145)
TCO2: 26 mmol/L (ref 22–32)

## 2023-12-12 SURGERY — COLONOSCOPY WITH PROPOFOL
Anesthesia: General

## 2023-12-12 MED ORDER — PROPOFOL 500 MG/50ML IV EMUL
INTRAVENOUS | Status: DC | PRN
Start: 2023-12-12 — End: 2023-12-12
  Administered 2023-12-12: 150 ug/kg/min via INTRAVENOUS

## 2023-12-12 MED ORDER — PHENYLEPHRINE 80 MCG/ML (10ML) SYRINGE FOR IV PUSH (FOR BLOOD PRESSURE SUPPORT)
PREFILLED_SYRINGE | INTRAVENOUS | Status: DC | PRN
  Administered 2023-12-12 (×3): 160 ug via INTRAVENOUS

## 2023-12-12 MED ORDER — LACTATED RINGERS IV SOLN: INTRAVENOUS | Status: DC | PRN

## 2023-12-12 MED ORDER — LACTATED RINGERS IV SOLN: INTRAVENOUS | Status: DC

## 2023-12-12 NOTE — Op Note (Signed)
 [facility] Patient Name: Joan  Collins Procedure Date: 12/12/2023 11:48 AM MRN: [id number] Date of Birth: 12/30/1955 Attending MD: Gemma Kelp , MD, [id number] CSN: [id number] Age: 68 Admit Type: Outpatient Procedure:                Upper GI endoscopy Indications:              Dysphagia Providers:                Gemma Kelp, MD, Pasco Bond, RN, Theola Fitch, [place] Wilson, Technician Referring MD:              Medicines:                Propofol  per Anesthesia Complications:            No immediate complications. Estimated Blood Loss:     Estimated blood loss was minimal. Procedure:                Pre-Anesthesia Assessment:                           - Prior to the procedure, a History and Physical                            was performed, and patient medications and                            allergies were reviewed. The patient's tolerance of                            previous anesthesia was also reviewed. The risks                            and benefits of the procedure and the sedation                            options and risks were discussed with the patient.                            All questions were answered, and informed consent                            was obtained. Prior Anticoagulants: The patient has                            taken no anticoagulant or antiplatelet agents. ASA                            Grade Assessment: III - A patient with severe                            systemic disease. After reviewing the risks and  benefits, the patient was deemed in satisfactory                            condition to undergo the procedure.                           After obtaining informed consent, the endoscope was                            passed under direct vision. Throughout the                            procedure, the patient's blood pressure, pulse, and                            oxygen  saturations were monitored continuously. The                            GIF-H190 ([id number]) scope was introduced through the                            mouth, and advanced to the second part of duodenum.                            The upper GI endoscopy was accomplished without                            difficulty. The patient tolerated the procedure                            well. Scope In: 12:56:23 PM Scope Out: 1:08:03 PM Total Procedure Duration: 0 hours 11 minutes 40 seconds  Findings:      Distal esophageal mucosa appeared desiccated in appearance. Appeared       patent throughout its course. No Barrett's esophagus appreciated no       tumor.      The duodenal bulb and second portion of the duodenum were normal.      Two non-bleeding cratered gastric ulcers were found at the pylorus. The       largest lesion was 8 mm in largest dimension.      Scope withdrawn and a 54 French Maloney dilator was passed to full       insertion with mild resistance. A look back revealed no apparent       complication related to this maneuver. Biopsies of the distal and       midesophagus were taken and submitted separately. Finally, biopsies of       the gastric mucosa were taken for histologic study Impression:               - Abnormal esophagus of uncertain significance.                            Status post Londa Rival dilation followed by biopsy                           - Non-bleeding gastric  ulcers(2). Status post                            gastric biopsy. Ulcers appeared benign                           - Normal duodenal bulb and second portion of the                            duodenum. Moderate Sedation:      Moderate (conscious) sedation was personally administered by an       anesthesia professional. The following parameters were monitored: oxygen       saturation, heart rate, blood pressure, respiratory rate, EKG, adequacy       of pulmonary ventilation, and response to  care. Recommendation:           - Patient has a contact number available for                            emergencies. The signs and symptoms of potential                            delayed complications were discussed with the                            patient. Return to normal activities tomorrow.                            Written discharge instructions were provided to the                            patient. Repeat EGD in 12 weeks.                           - Advance diet as tolerated. Continue Protonix  40                            mg twice daily. Follow-up on pathology. Avoid all                            forms of nonsteroidal agents including aspirin                            preparations Procedure Code(s):        --- Professional ---                           [phone], Esophagogastroduodenoscopy, flexible,                            transoral; diagnostic, including collection of                            specimen(s) by brushing or washing, when performed                            (  separate procedure) Diagnosis Code(s):        --- Professional ---                           K25.9, Gastric ulcer, unspecified as acute or                            chronic, without hemorrhage or perforation                           R13.10, Dysphagia, unspecified CPT copyright 2022 American Medical Association. All rights reserved. The codes documented in this report are preliminary and upon coder review may  be revised to meet current compliance requirements. Windsor Hatcher. Chamberlain Steinborn, MD Gemma Kelp, MD 12/12/2023 1:40:35 PM This report has been signed electronically. Number of Addenda: 0

## 2023-12-12 NOTE — Discharge Instructions (Addendum)
 EGD Discharge instructions Please read the instructions outlined below and refer to this sheet in the next few weeks. These discharge instructions provide you with general information on caring for yourself after you leave the hospital. Your doctor may also give you specific instructions. While your treatment has been planned according to the most current medical practices available, unavoidable complications occasionally occur. If you have any problems or questions after discharge, please call your doctor. ACTIVITY You may resume your regular activity but move at a slower pace for the next 24 hours.  Take frequent rest periods for the next 24 hours.  Walking will help expel (get rid of) the air and reduce the bloated feeling in your abdomen.  No driving for 24 hours (because of the anesthesia (medicine) used during the test).  You may shower.  Do not sign any important legal documents or operate any machinery for 24 hours (because of the anesthesia used during the test).  NUTRITION Drink plenty of fluids.  You may resume your normal diet.  Begin with a light meal and progress to your normal diet.  Avoid alcoholic beverages for 24 hours or as instructed by your caregiver.  MEDICATIONS You may resume your normal medications unless your caregiver tells you otherwise.  WHAT YOU CAN EXPECT TODAY You may experience abdominal discomfort such as a feeling of fullness or "gas" pains.  FOLLOW-UP Your doctor will discuss the results of your test with you.  SEEK IMMEDIATE MEDICAL ATTENTION IF ANY OF THE FOLLOWING OCCUR: Excessive nausea (feeling sick to your stomach) and/or vomiting.  Severe abdominal pain and distention (swelling).  Trouble swallowing.  Temperature over 101 F (37.8 C).  Rectal bleeding or vomiting of blood.    Colonoscopy Discharge Instructions  Read the instructions outlined below and refer to this sheet in the next few weeks. These discharge instructions provide you with  general information on caring for yourself after you leave the hospital. Your doctor may also give you specific instructions. While your treatment has been planned according to the most current medical practices available, unavoidable complications occasionally occur. If you have any problems or questions after discharge, call Dr. Riley Cheadle at [phone]. ACTIVITY You may resume your regular activity, but move at a slower pace for the next 24 hours.  Take frequent rest periods for the next 24 hours.  Walking will help get rid of the air and reduce the bloated feeling in your belly (abdomen).  No driving for 24 hours (because of the medicine (anesthesia) used during the test).   Do not sign any important legal documents or operate any machinery for 24 hours (because of the anesthesia used during the test).  NUTRITION Drink plenty of fluids.  You may resume your normal diet as instructed by your doctor.  Begin with a light meal and progress to your normal diet. Heavy or fried foods are harder to digest and may make you feel sick to your stomach (nauseated).  Avoid alcoholic beverages for 24 hours or as instructed.  MEDICATIONS You may resume your normal medications unless your doctor tells you otherwise.  WHAT YOU CAN EXPECT TODAY Some feelings of bloating in the abdomen.  Passage of more gas than usual.  Spotting of blood in your stool or on the toilet paper.  IF YOU HAD POLYPS REMOVED DURING THE COLONOSCOPY: No aspirin products for 7 days or as instructed.  No alcohol for 7 days or as instructed.  Eat a soft diet for the next 24 hours.  FINDING  OUT THE RESULTS OF YOUR TEST Not all test results are available during your visit. If your test results are not back during the visit, make an appointment with your caregiver to find out the results. Do not assume everything is normal if you have not heard from your caregiver or the medical facility. It is important for you to follow up on all of your test  results.  SEEK IMMEDIATE MEDICAL ATTENTION IF: You have more than a spotting of blood in your stool.  Your belly is swollen (abdominal distention).  You are nauseated or vomiting.  You have a temperature over 101.  You have abdominal pain or discomfort that is severe or gets worse throughout the day.       Stomach and esophagus inflamed.  You have stomach ulcers.  Likely due to Excedrin Migraine.  Need to stop using all forms of aspirin including Excedrin Migraine   continue Protonix  twice daily  Follow-up on pathology.  You will need a repeat EGD in 12 weeks to verify ulcer healing   1 polyp found in your colon and removed colon appeared somewhat inflamed as well could be due to Excedrin.  Biopsies taken  Further recommendations to follow pending review of pathology report  At patient request, I called [facility] [phone] reviewed findings and recommendations

## 2023-12-12 NOTE — Op Note (Signed)
 [facility] Patient Name: Joan Collins  Poon Procedure Date: 12/12/2023 11:45 AM MRN: [id number] Date of Birth: [birth year] Attending MD: Gemma Kelp , MD, [id number] CSN: [id number] Age: 68 Admit Type: Outpatient Procedure:                Colonoscopy Indications:              Screening for colorectal malignant neoplasm Providers:                Gemma Kelp, MD, Pasco Bond, RN, [place]                            Wilson, Technician, Theola Fitch Referring MD:              Medicines:                Propofol  per Anesthesia Complications:            No immediate complications. Estimated Blood Loss:     Estimated blood loss was minimal. Procedure:                Pre-Anesthesia Assessment:                           - Prior to the procedure, a History and Physical                            was performed, and patient medications and                            allergies were reviewed. The patient's tolerance of                            previous anesthesia was also reviewed. The risks                            and benefits of the procedure and the sedation                            options and risks were discussed with the patient.                            All questions were answered, and informed consent                            was obtained. Prior Anticoagulants: The patient has                            taken no anticoagulant or antiplatelet agents. ASA                            Grade Assessment: III - A patient with severe                            systemic disease. After reviewing the risks and  benefits, the patient was deemed in satisfactory                            condition to undergo the procedure.                           After obtaining informed consent, the colonoscope                            was passed under direct vision. Throughout the                            procedure, the patient's blood pressure, pulse, and                             oxygen saturations were monitored continuously. The                            [id number]-HQ190L ([id number]) scope was introduced through                            the anus and advanced to the the cecum, identified                            by appendiceal orifice and ileocecal valve. The                            colonoscopy was performed without difficulty. The                            patient tolerated the procedure well. The quality                            of the bowel preparation was adequate. The                            ileocecal valve, appendiceal orifice, and rectum                            were photographed. The entire colon was well                            visualized. The colonoscopy was performed without                            difficulty. Scope In: 1:12:59 PM Scope Out: 1:34:54 PM Scope Withdrawal Time: 0 hours 13 minutes 57 seconds  Total Procedure Duration: 0 hours 21 minutes 55 seconds  Findings:      The perianal and digital rectal examinations were normal. Regional areas       of granularity loss of the normal vascular pattern in the ascending       segment descending segment and distal rectum. No frank ulceration seen.       Segmental biopsies taken.      Scattered medium-mouthed diverticula were  found in the sigmoid colon and       descending colon.      A 7 mm polyp was found in the ascending colon. The polyp was sessile.       The polyp was removed with a cold snare. Resection and retrieval were       complete. Estimated blood loss was minimal. Impression:               - Diverticulosis in the sigmoid colon and in the                            descending colon. Patchy areas of colonic and                            rectal inflammation as described status post biopsy                           - One 7 mm polyp in the ascending colon, removed                            with a cold snare. Resected and retrieved.                            - Daughter really admits patient takes Excedrin                            Migraine "all the time". States patient knows                            better. Moderate Sedation:      Moderate (conscious) sedation was personally administered by an       anesthesia professional. The following parameters were monitored: oxygen       saturation, heart rate, blood pressure, respiratory rate, EKG, adequacy       of pulmonary ventilation, and response to care. Recommendation:           - Patient has a contact number available for                            emergencies. The signs and symptoms of potential                            delayed complications were discussed with the                            patient. Return to normal activities tomorrow.                            Written discharge instructions were provided to the                            patient.                           - Advance diet as tolerated. Protonix  40 mg twice  daily. Follow-up on pathology. Office visit with us                             in 12 weeks. See EGD report. Avoid all                            aspirin/NSAID products. Procedure Code(s):        --- Professional ---                           [id number], Colonoscopy, flexible; with removal of                            tumor(s), polyp(s), or other lesion(s) by snare                            technique Diagnosis Code(s):        --- Professional ---                           Z12.11, Encounter for screening for malignant                            neoplasm of colon                           D12.2, Benign neoplasm of ascending colon                           K57.30, Diverticulosis of large intestine without                            perforation or abscess without bleeding CPT copyright 2022 American Medical Association. All rights reserved. The codes documented in this report are preliminary and upon coder review may  be revised to meet current  compliance requirements. Windsor Hatcher. Browning [place], MD Gemma Kelp, MD 12/12/2023 1:47:24 PM This report has been signed electronically. Number of Addenda: 0

## 2023-12-12 NOTE — Transfer of Care (Signed)
 Immediate Anesthesia Transfer of Care Note  Patient: Joan Collins  Procedure(s) Performed: COLONOSCOPY WITH PROPOFOL  ESOPHAGOGASTRODUODENOSCOPY (EGD) WITH PROPOFOL  DILATION, ESOPHAGUS, USING MALONEY DILATOR  Patient Location: Endoscopy Unit  Anesthesia Type:General  Level of Consciousness: awake, alert , oriented, and patient cooperative  Airway & Oxygen Therapy: Patient Spontanous Breathing  Post-op Assessment: Report given to RN, Post -op Vital signs reviewed and stable, and Patient moving all extremities X 4  Post vital signs: Reviewed and stable  Last Vitals:  Vitals Value Taken Time  BP 104/59 12/12/23 1342  Temp 36.2 C 12/12/23 1342  Pulse 66 12/12/23 1342  Resp 17 12/12/23 1342  SpO2 100 % 12/12/23 1342    Last Pain:  Vitals:   12/12/23 1342  TempSrc: Axillary  PainSc: 0-No pain      Patients Stated Pain Goal: 7 (12/12/23 1213)  Complications: No notable events documented.

## 2023-12-12 NOTE — H&P (Signed)
 @LOGO @   Primary Care Physician:  Chales Colorado, FNP Primary Gastroenterologist:  Dr. Riley Cheadle  Pre-Procedure History & Physical: HPI:  Joan  ANYAE Collins is a 68 y.o. female here for for average rescreening colonoscopy last colonoscopy 10 years ago-diverticulosis.  Also esophageal dysphagia here for EGD with esophageal dilation as feasible/appropriate per plan.  Past Medical History:  Diagnosis Date   Arthritis    GERD (gastroesophageal reflux disease)    Headache(784.0)    Migraines   Hypertension    PONV (postoperative nausea and vomiting)     Past Surgical History:  Procedure Laterality Date   BIOPSY  09/15/2020   Procedure: BIOPSY;  Surgeon: Suzette Espy, MD;  Location: [facility];  Service: Endoscopy;;   CARPAL TUNNEL RELEASE Right    COLONOSCOPY N/A 05/04/2014   Dr. Riley Cheadle: Diverticulosis, single polyp removed, no adenomatous changes.  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  Nonbleeding gastric ulcers likely NSAID related/celebrex/excedrin (biopsy with ulcer with reactive changes, no malignancy, no H. pylori).   ESOPHAGOGASTRODUODENOSCOPY N/A 04/20/2021   Procedure: ESOPHAGOGASTRODUODENOSCOPY (EGD);  Surgeon: Suzette Espy, MD;  Location: [facility];  Service: Endoscopy;  Laterality: N/A;  11:15am   TUBAL LIGATION      Prior to Admission medications   Medication Sig Start Date End Date Taking? Authorizing Provider  atorvastatin (LIPITOR) 80 MG tablet Take 80 mg by mouth daily. 02/03/19  Yes [provider]  hydrochlorothiazide  (HYDRODIURIL ) 12.5 MG tablet Take 12.5 mg by mouth daily. 09/24/23  Yes [provider]  mirtazapine (REMERON) 30 MG tablet Take 30 mg by mouth at bedtime. 09/24/23  Yes [provider]  Multiple Vitamin (MULTIVITAMIN) tablet  Take 1 tablet by mouth daily.   Yes [provider]  pantoprazole  (PROTONIX ) 40 MG tablet Take 1 tablet (40 mg total) by mouth 2 (two) times daily before a meal. 09/12/21  Yes Lanney Pitts, PA-C  potassium chloride  SA (KLOR-CON  M) 20 MEQ tablet Take 2 tablets (40 mEq total) by mouth 2 (two) times daily. Pt is to take 40 mEq once today (12/10/23), 40 mEq bid tomorrow(12/11/2023) and 40 mEq the morning of procedure with sips of water  (12/12/2023). 12/10/23  Yes Novalie Leamy, Windsor Hatcher, MD  topiramate (TOPAMAX) 50 MG tablet Take 50 mg by mouth 2 (two) times daily. 09/27/23  Yes [provider]  vitamin B-12 (CYANOCOBALAMIN) 1000 MCG tablet Take 1,000 mcg by mouth in the morning.   Yes [provider]    Allergies as of 10/15/2023   (No Known Allergies)    Family History  Problem Relation Age of Onset   Colon cancer Neg Hx     Social History   Socioeconomic History   Marital status: Widowed    Spouse name: Not on file   Number of children: Not on file   Years of education: Not on file   Highest education level: Not on file  Occupational History   Not on file  Tobacco Use   Smoking status: Every Day    Current packs/day: 0.50    Average packs/day: 0.5 packs/day for 20.0 years (10.0 ttl pk-yrs)    Types: Cigarettes   Smokeless tobacco: Never  Vaping Use   Vaping status: Never Used  Substance and Sexual Activity   Alcohol use: No   Drug use: No   Sexual activity:  Not on file  Other Topics Concern   Not on file  Social History Narrative   Not on file   Social Drivers of Health   Financial Resource Strain: Not on file  Food Insecurity: Not on file  Transportation Needs: Not on file  Physical Activity: Not on file  Stress: Not on file  Social Connections: Not on file  Intimate Partner Violence: Not on file    Review of Systems: See HPI, otherwise negative ROS  Physical Exam: BP 131/76   Pulse 78   Temp 98 F (36.7 C) (Oral)   Ht 5\' 1"  (1.549 m)   Wt  38.9 kg   SpO2 98%   BMI 16.20 kg/m  General:   Alert,  Well-developed, well-nourished, pleasant and cooperative in NAD Neck:  Supple; no masses or thyromegaly. No significant cervical adenopathy. Lungs:  Clear throughout to auscultation.   No wheezes, crackles, or rhonchi. No acute distress. Heart:  Regular rate and rhythm; no murmurs, clicks, rubs,  or gallops. Abdomen: Non-distended, normal bowel sounds.  Soft and nontender without appreciable mass or hepatosplenomegaly.   Impression/Plan: 68 year old lady here for average rescreening colonoscopy and EGD to further evaluate esophageal dysphagia. The risks, benefits, limitations, alternatives and imponderables have been reviewed with the patient. Questions have been answered. All parties are agreeable.   The risks, benefits, limitations, alternatives and imponderables have been reviewed with the patient. Potential for esophageal dilation, biopsy, etc. have also been reviewed.  Questions have been answered. All parties agreeable.   Potassium 2.6 preop.  Supplemented with oral KCl.  I-STAT potassium this afternoon 3.5.     Notice: This dictation was prepared with Dragon dictation along with smaller phrase technology. Any transcriptional errors that result from this process are unintentional and may not be corrected upon review.

## 2023-12-12 NOTE — Anesthesia Preprocedure Evaluation (Signed)
 Anesthesia Evaluation  Patient identified by MRN, date of birth, ID band Patient awake    Reviewed: Allergy & Precautions, H&P , NPO status , Patient's Chart, lab work & pertinent test results, reviewed documented beta blocker date and time   History of Anesthesia Complications (+) PONV and history of anesthetic complications  Airway Mallampati: II  TM Distance: >3 FB Neck ROM: full    Dental no notable dental hx.    Pulmonary neg pulmonary ROS, Current Smoker   Pulmonary exam normal breath sounds clear to auscultation       Cardiovascular Exercise Tolerance: Good hypertension, negative cardio ROS  Rhythm:regular Rate:Normal     Neuro/Psych  Headaches negative neurological ROS  negative psych ROS   GI/Hepatic negative GI ROS, Neg liver ROS, PUD,GERD  ,,  Endo/Other  negative endocrine ROS    Renal/GU negative Renal ROS  negative genitourinary   Musculoskeletal   Abdominal   Peds  Hematology negative hematology ROS (+)   Anesthesia Other Findings   Reproductive/Obstetrics negative OB ROS                             Anesthesia Physical Anesthesia Plan  ASA: 2  Anesthesia Plan: General   Post-op Pain Management:    Induction:   PONV Risk Score and Plan: Propofol  infusion  Airway Management Planned:   Additional Equipment:   Intra-op Plan:   Post-operative Plan:   Informed Consent: I have reviewed the patients History and Physical, chart, labs and discussed the procedure including the risks, benefits and alternatives for the proposed anesthesia with the patient or authorized representative who has indicated his/her understanding and acceptance.     Dental Advisory Given  Plan Discussed with: CRNA  Anesthesia Plan Comments:        Anesthesia Quick Evaluation

## 2023-12-13 ENCOUNTER — Encounter (HOSPITAL_COMMUNITY): Payer: Self-pay | Admitting: Internal Medicine

## 2023-12-13 LAB — SURGICAL PATHOLOGY

## 2023-12-13 NOTE — Anesthesia Postprocedure Evaluation (Signed)
 Anesthesia Post Note  Patient: Joan  G Collins  Procedure(s) Performed: COLONOSCOPY WITH PROPOFOL  ESOPHAGOGASTRODUODENOSCOPY (EGD) WITH PROPOFOL  DILATION, ESOPHAGUS, USING MALONEY DILATOR  Patient location during evaluation: Phase II Anesthesia Type: General Level of consciousness: awake Pain management: pain level controlled Vital Signs Assessment: post-procedure vital signs reviewed and stable Respiratory status: spontaneous breathing and respiratory function stable Cardiovascular status: blood pressure returned to baseline and stable Postop Assessment: no headache and no apparent nausea or vomiting Anesthetic complications: no Comments: Late entry   No notable events documented.   Last Vitals:  Vitals:   12/12/23 1213 12/12/23 1342  BP: 131/76 (!) 104/59  Pulse: 78 66  Resp:  17  Temp: 36.7 C (!) 36.2 C  SpO2: 98% 100%    Last Pain:  Vitals:   12/12/23 1342  TempSrc: Axillary  PainSc: 0-No pain                 Coretha Dew

## 2023-12-14 ENCOUNTER — Encounter: Payer: Self-pay | Admitting: Internal Medicine

## 2024-02-12 ENCOUNTER — Ambulatory Visit (INDEPENDENT_AMBULATORY_CARE_PROVIDER_SITE_OTHER): Admitting: Internal Medicine

## 2024-02-12 ENCOUNTER — Encounter: Payer: Self-pay | Admitting: Internal Medicine

## 2024-02-12 ENCOUNTER — Telehealth: Payer: Self-pay | Admitting: *Deleted

## 2024-02-12 VITALS — BP 129/79 | HR 85 | Temp 98.1°F | Ht 60.0 in | Wt 85.6 lb

## 2024-02-12 DIAGNOSIS — K219 Gastro-esophageal reflux disease without esophagitis: Secondary | ICD-10-CM

## 2024-02-12 DIAGNOSIS — K259 Gastric ulcer, unspecified as acute or chronic, without hemorrhage or perforation: Secondary | ICD-10-CM

## 2024-02-12 DIAGNOSIS — R1319 Other dysphagia: Secondary | ICD-10-CM

## 2024-02-12 DIAGNOSIS — K52831 Collagenous colitis: Secondary | ICD-10-CM | POA: Diagnosis not present

## 2024-02-12 DIAGNOSIS — K279 Peptic ulcer, site unspecified, unspecified as acute or chronic, without hemorrhage or perforation: Secondary | ICD-10-CM

## 2024-02-12 MED ORDER — BUDESONIDE 3 MG PO CPEP
9.0000 mg | ORAL_CAPSULE | Freq: Every day | ORAL | 2 refills | Status: DC
Start: 2024-02-12 — End: 2024-05-06

## 2024-02-12 NOTE — Telephone Encounter (Signed)
Attempted to call pt, unable to leave message due to mailbox being full.

## 2024-02-12 NOTE — Patient Instructions (Signed)
 It was good to see you again today.  As discussed, we will schedule a repeat EGD to assess for peptic ulcer healing.  ASA 3  Continue pantoprazole  40 mg twice daily for now  I am glad you have stopped using aspirin entirely  Because of change in your bowel habits and inflammation found in your colon we will provide treatment with Entocort tablets.  Take (3) 3 mg tablets daily.  Dispense 90 with 2 additional refills.  Office visit here in 3 months.

## 2024-02-12 NOTE — H&P (View-Only) (Signed)
 -*   Primary Care Physician:  Malachy Burnard Helling, FNP Primary Gastroenterologist:  Dr. Shaaron  Pre-Procedure History & Physical: HPI:  Joan  TEAGHAN Collins is a 68 y.o. female here for follow-up of gastric ulcer.  Biopsies negative for H. pylori malignancy.  Patient was taking a fair amount of Excedrin Migraine.  She stopped taking all aspirin products now uses nonaspirin preparations.  Normal-appearing esophagus dilated empirically.  No EOE on biopsies patient states dysphagia has resolved reflux symptoms pretty well squelch with twice daily PPI (Protonix ).  Future colonoscopy is not recommended for colorectal cancer screening purposes.  She did have a polyp on her recent colonoscopy which was benign.  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  She has not had incessant watery nonbloody diarrhea but states she started having diarrhea after her procedure and it has been intermittent.  Specific treatment for collagenous colitis was withheld given lack of the hallmark symptom of diarrhea leading to her colonoscopy.  She states that now she does not always have diarrhea.  Sometimes is mildly constipated.  Past Medical History:  Diagnosis Date   Arthritis    GERD (gastroesophageal reflux disease)    Headache(784.0)    Migraines   Hypertension    PONV (postoperative nausea and vomiting)     Past Surgical History:  Procedure Laterality Date   BIOPSY  09/15/2020   Procedure: BIOPSY;  Surgeon: Shaaron Lamar HERO, MD;  [facility];  Service: Endoscopy;;   CARPAL TUNNEL RELEASE Right    COLONOSCOPY N/A 05/04/2014   Dr. Shaaron: Diverticulosis, single polyp removed, no adenomatous changes.  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  Nonbleeding gastric ulcers likely NSAID related/celebrex/excedrin (biopsy with ulcer with reactive changes, no malignancy, no H. pylori).   ESOPHAGOGASTRODUODENOSCOPY N/A 04/20/2021   Procedure: ESOPHAGOGASTRODUODENOSCOPY (EGD);  Surgeon: Shaaron Lamar HERO, MD;  [facility];  Service: Endoscopy;  Laterality: N/A;  11:15am   ESOPHAGOGASTRODUODENOSCOPY (EGD) WITH PROPOFOL  N/A 12/12/2023   Procedure: ESOPHAGOGASTRODUODENOSCOPY (EGD) WITH PROPOFOL ;  Surgeon: Shaaron Lamar HERO, MD;  [facility]: [facility];  Service: Endoscopy;  Laterality: N/A;   MALONEY DILATION N/A 12/12/2023   Procedure: DILATION, ESOPHAGUS, USING MALONEY DILATOR;  Surgeon: Shaaron Lamar HERO, MD;  [facility]: [facility];  Service: Endoscopy;  Laterality: N/A;   TUBAL LIGATION      Prior to Admission medications   Medication Sig Start Date End Date Taking? Authorizing Provider  atorvastatin (LIPITOR) 20 MG tablet Take 20 mg by mouth at bedtime. 01/21/24  Yes [provider]  mirtazapine (REMERON) 30 MG tablet Take 30 mg by mouth at bedtime. 09/24/23  Yes [provider]  Multiple Vitamin (MULTIVITAMIN) tablet Take 1 tablet by mouth daily.   Yes [provider]  pantoprazole  (PROTONIX ) 40 MG tablet Take 1 tablet (40 mg total) by mouth 2 (two) times daily before a meal. 09/12/21  Yes Ezzard Sonny RAMAN, PA-C  topiramate (TOPAMAX) 100 MG tablet Take 100 mg by mouth 2 (two) times daily. 01/21/24  Yes [provider]  vitamin B-12 (CYANOCOBALAMIN) 1000 MCG tablet Take 1,000 mcg by mouth in the morning.   Yes [provider]  hydrochlorothiazide  (HYDRODIURIL ) 12.5 MG tablet Take 12.5 mg by mouth daily. Patient not taking: Reported on  02/12/2024 09/24/23   [provider]  potassium chloride  SA (KLOR-CON  M) 20 MEQ tablet Take 2 tablets (40 mEq total) by mouth 2 (two) times daily. Pt is to take 40 mEq once today (12/10/23), 40 mEq bid tomorrow(12/11/2023) and 40 mEq the morning of procedure with sips of water  (12/12/2023). Patient not taking: Reported on 02/12/2024 12/10/23   Shaaron Lamar HERO, MD    Allergies as of 02/12/2024   (No Known Allergies)    Family History  Problem Relation Age of Onset   Colon cancer Neg Hx     Social History   Socioeconomic History   Marital status: Widowed    Spouse name: Not on file   Number of children: Not on file   Years of education: Not on file   Highest education level: Not on file  Occupational History   Not on file  Tobacco Use   Smoking status: Every Day    Current packs/day: 0.50    Average packs/day: 0.5 packs/day for 20.0 years (10.0 ttl pk-yrs)    Types: Cigarettes   Smokeless tobacco: Never  Vaping Use   Vaping status: Never Used  Substance and Sexual Activity   Alcohol use: No   Drug use: No   Sexual activity: Not on file  Other Topics Concern   Not on file  Social History Narrative   Not on file   Social Drivers of Health   Financial Resource Strain: Not on file  Food Insecurity: Not on file  Transportation Needs: Not on file  Physical Activity: Not on file  Stress: Not on file  Social Connections: Not on file  Intimate Partner Violence: Not on file    Review of Systems: See HPI, otherwise negative ROS  Physical Exam: BP 129/79 (BP Location: Right Arm, Patient Position: Sitting, Cuff Size: Small)   Pulse 85   Temp 98.1 F (36.7 C) (Oral)   Ht 5' (1.524 m)   Wt 85 lb 9.6 oz (38.8 kg)   SpO2 95%   BMI 16.72 kg/m  General: Somewhat frail-appearing alert,  pleasant and cooperative in NAD Neck:  Supple; no masses or thyromegaly. No significant cervical adenopathy. Lungs:  Clear throughout to auscultation.   No wheezes, crackles, or rhonchi.  No acute distress. Heart:  Regular rate and rhythm; no murmurs, clicks, rubs,  or gallops. Abdomen: Non-distended, normal bowel sounds.  Soft and nontender without appreciable mass or hepatosplenomegaly.   Impression/Plan: Dysphagia resolved after empiric esophageal dilation.  No esophageal lesion found.  Biopsies negative for EOE.  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  Biopsies negative.  Needs surveillance exam for verification of healing.  Collagenous colitis documented on biopsies throughout her colon.  Colon polyp benign.  Phenotypically, this lady reported not having diarrhea prior to her colonoscopy.  Had a severe bout for a couple of weeks following her colonoscopy.  Her bowel function tends to fluctuate.  I feel at this time it would be best to go ahead and treat her for collagenous colitis.  Recommendations: As discussed, we will schedule a repeat EGD to assess for peptic ulcer healing.  ASA 3  Continue pantoprazole  40 mg twice daily for now  I am glad you have stopped using aspirin entirely  Because of change  in your bowel habits and inflammation found in your colon we will provide treatment with Entocort tablets.  Take (3) 3 mg tablets daily.  Dispense 90 with 2 additional refills.  Office visit here in 3 months.  Notice: This dictation was prepared with Dragon dictation along with smaller phrase technology. Any transcriptional errors that result from this process are unintentional and may not be corrected upon review.

## 2024-02-12 NOTE — Progress Notes (Signed)
 -*   Primary Care Physician:  Malachy Burnard Helling, FNP Primary Gastroenterologist:  Dr. Shaaron  Pre-Procedure History & Physical: HPI:  Joan  TEAGHAN Collins is a 68 y.o. female here for follow-up of gastric ulcer.  Biopsies negative for H. pylori malignancy.  Patient was taking a fair amount of Excedrin Migraine.  She stopped taking all aspirin products now uses nonaspirin preparations.  Normal-appearing esophagus dilated empirically.  No EOE on biopsies patient states dysphagia has resolved reflux symptoms pretty well squelch with twice daily PPI (Protonix ).  Future colonoscopy is not recommended for colorectal cancer screening purposes.  She did have a polyp on her recent colonoscopy which was benign.  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  She has not had incessant watery nonbloody diarrhea but states she started having diarrhea after her procedure and it has been intermittent.  Specific treatment for collagenous colitis was withheld given lack of the hallmark symptom of diarrhea leading to her colonoscopy.  She states that now she does not always have diarrhea.  Sometimes is mildly constipated.  Past Medical History:  Diagnosis Date   Arthritis    GERD (gastroesophageal reflux disease)    Headache(784.0)    Migraines   Hypertension    PONV (postoperative nausea and vomiting)     Past Surgical History:  Procedure Laterality Date   BIOPSY  09/15/2020   Procedure: BIOPSY;  Surgeon: Shaaron Lamar HERO, MD;  [facility]: [facility];  Service: Endoscopy;;   CARPAL TUNNEL RELEASE Right    COLONOSCOPY N/A 05/04/2014   Dr. Shaaron: Diverticulosis, single polyp removed, no adenomatous changes.  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  Nonbleeding gastric ulcers likely NSAID related/celebrex/excedrin (biopsy with ulcer with reactive changes, no malignancy, no H. pylori).   ESOPHAGOGASTRODUODENOSCOPY N/A 04/20/2021   Procedure: ESOPHAGOGASTRODUODENOSCOPY (EGD);  Surgeon: Shaaron Lamar HERO, MD;  [facility];  Service: Endoscopy;  Laterality: N/A;  11:15am   ESOPHAGOGASTRODUODENOSCOPY (EGD) WITH PROPOFOL  N/A 12/12/2023   Procedure: ESOPHAGOGASTRODUODENOSCOPY (EGD) WITH PROPOFOL ;  Surgeon: Shaaron Lamar HERO, MD;  [facility];  Service: Endoscopy;  Laterality: N/A;   MALONEY DILATION N/A 12/12/2023   Procedure: DILATION, ESOPHAGUS, USING MALONEY DILATOR;  Surgeon: Shaaron Lamar HERO, MD;  [facility];  Service: Endoscopy;  Laterality: N/A;   TUBAL LIGATION      Prior to Admission medications   Medication Sig Start Date End Date Taking? Authorizing Provider  atorvastatin (LIPITOR) 20 MG tablet Take 20 mg by mouth at bedtime. 01/21/24  Yes [provider]  mirtazapine (REMERON) 30 MG tablet Take 30 mg by mouth at bedtime. 09/24/23  Yes [provider]  Multiple Vitamin (MULTIVITAMIN) tablet Take 1 tablet by mouth daily.   Yes [provider]  pantoprazole  (PROTONIX ) 40 MG tablet Take 1 tablet (40 mg total) by mouth 2 (two) times daily before a meal. 09/12/21  Yes Ezzard Sonny RAMAN, PA-C  topiramate (TOPAMAX) 100 MG tablet Take 100 mg by mouth 2 (two) times daily. 01/21/24  Yes [provider]  vitamin B-12 (CYANOCOBALAMIN) 1000 MCG tablet Take 1,000 mcg by mouth in the morning.   Yes [provider]  hydrochlorothiazide  (HYDRODIURIL ) 12.5 MG tablet Take 12.5 mg by mouth daily. Patient not taking: Reported on  02/12/2024 09/24/23   [provider]  potassium chloride  SA (KLOR-CON  M) 20 MEQ tablet Take 2 tablets (40 mEq total) by mouth 2 (two) times daily. Pt is to take 40 mEq once today (12/10/23), 40 mEq bid tomorrow(12/11/2023) and 40 mEq the morning of procedure with sips of water  (12/12/2023). Patient not taking: Reported on 02/12/2024 12/10/23   Shaaron Lamar HERO, MD    Allergies as of 02/12/2024   (No Known Allergies)    Family History  Problem Relation Age of Onset   Colon cancer Neg Hx     Social History   Socioeconomic History   Marital status: Widowed    Spouse name: Not on file   Number of children: Not on file   Years of education: Not on file   Highest education level: Not on file  Occupational History   Not on file  Tobacco Use   Smoking status: Every Day    Current packs/day: 0.50    Average packs/day: 0.5 packs/day for 20.0 years (10.0 ttl pk-yrs)    Types: Cigarettes   Smokeless tobacco: Never  Vaping Use   Vaping status: Never Used  Substance and Sexual Activity   Alcohol use: No   Drug use: No   Sexual activity: Not on file  Other Topics Concern   Not on file  Social History Narrative   Not on file   Social Drivers of Health   Financial Resource Strain: Not on file  Food Insecurity: Not on file  Transportation Needs: Not on file  Physical Activity: Not on file  Stress: Not on file  Social Connections: Not on file  Intimate Partner Violence: Not on file    Review of Systems: See HPI, otherwise negative ROS  Physical Exam: BP 129/79 (BP Location: Right Arm, Patient Position: Sitting, Cuff Size: Small)   Pulse 85   Temp 98.1 F (36.7 C) (Oral)   Ht 5' (1.524 m)   Wt 85 lb 9.6 oz (38.8 kg)   SpO2 95%   BMI 16.72 kg/m  General: Somewhat frail-appearing alert,  pleasant and cooperative in NAD Neck:  Supple; no masses or thyromegaly. No significant cervical adenopathy. Lungs:  Clear throughout to auscultation.   No wheezes, crackles, or rhonchi.  No acute distress. Heart:  Regular rate and rhythm; no murmurs, clicks, rubs,  or gallops. Abdomen: Non-distended, normal bowel sounds.  Soft and nontender without appreciable mass or hepatosplenomegaly.   Impression/Plan: Dysphagia resolved after empiric esophageal dilation.  No esophageal lesion found.  Biopsies negative for EOE.  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  Biopsies negative.  Needs surveillance exam for verification of healing.  Collagenous colitis documented on biopsies throughout her colon.  Colon polyp benign.  Phenotypically, this lady reported not having diarrhea prior to her colonoscopy.  Had a severe bout for a couple of weeks following her colonoscopy.  Her bowel function tends to fluctuate.  I feel at this time it would be best to go ahead and treat her for collagenous colitis.  Recommendations: As discussed, we will schedule a repeat EGD to assess for peptic ulcer healing.  ASA 3  Continue pantoprazole  40 mg twice daily for now  I am glad you have stopped using aspirin entirely  Because of change  in your bowel habits and inflammation found in your colon we will provide treatment with Entocort tablets.  Take (3) 3 mg tablets daily.  Dispense 90 with 2 additional refills.  Office visit here in 3 months.  Notice: This dictation was prepared with Dragon dictation along with smaller phrase technology. Any transcriptional errors that result from this process are unintentional and may not be corrected upon review.

## 2024-02-13 NOTE — Telephone Encounter (Signed)
Attempted to contact pt, unable to leave message due to mailbox being full.

## 2024-02-15 ENCOUNTER — Encounter: Payer: Self-pay | Admitting: *Deleted

## 2024-02-15 NOTE — Telephone Encounter (Signed)
 Attempted to call pt, unable to leave message due to mailbox being full. Letter mailed.

## 2024-02-18 NOTE — Telephone Encounter (Signed)
 Pt left VM returning a call and will be available. Tammy please advise. I don't see in notes what she needs.

## 2024-02-18 NOTE — Telephone Encounter (Signed)
 Attempted to call pt back, unable to leave message due to mailbox being full.   EGD w/Dr.Rourk, asa 3

## 2024-02-20 ENCOUNTER — Telehealth: Payer: Self-pay | Admitting: Internal Medicine

## 2024-02-20 NOTE — Telephone Encounter (Signed)
 Patient left a message that she was playing phone tag with our office.  She said someone had been trying to call her and I saw your note.

## 2024-02-21 ENCOUNTER — Encounter: Payer: Self-pay | Admitting: *Deleted

## 2024-02-21 NOTE — Telephone Encounter (Signed)
 Pt has been scheduled for 03/12/24. Instructions mailed.

## 2024-03-06 NOTE — Patient Instructions (Signed)
 Joan  G Collins  03/06/2024     @[email or web address] @   Your procedure is scheduled on  03/12/2024.   Report to [facility] at 1230  P.M.   Call this number if you have problems the morning of surgery:  [phone]  If you experience any cold or flu symptoms such as cough, fever, chills, shortness of breath, etc. between now and your scheduled surgery, please notify us  at the above number.   Remember:  Follow the diet instructions given to you by the office.    You may drink clear liquids until  1030 am on 03/12/2024.    Clear liquids allowed are:                    Water , Juice (No red color; non-citric and without pulp; diabetics please choose diet or no sugar options), Carbonated beverages (diabetics please choose diet or no sugar options), Clear Tea (No creamer, milk, or cream, including half & half and powdered creamer), Black Coffee Only (No creamer, milk or cream, including half & half and powdered creamer), and Clear Sports drink (No red color; diabetics please choose diet or no sugar options)    Take these medicines the morning of surgery with A SIP OF WATER                        budesonide , pantoprazole , topiramate.    Do not wear jewelry, make-up or nail polish, including gel polish,  artificial nails, or any other type of covering on natural nails (fingers and  toes).  Do not wear lotions, powders, or perfumes, or deodorant.  Do not shave 48 hours prior to surgery.  Men may shave face and neck.  Do not bring valuables to the hospital.  [facility] is not responsible for any belongings or valuables.  Contacts, dentures or bridgework may not be worn into surgery.  Leave your suitcase in the car.  After surgery it may be brought to your room.  For patients admitted to the hospital, discharge time will be determined by your treatment team.  Patients discharged the day of surgery will not be allowed to drive home and must have someone with them for 24 hours.     Special instructions:   DO NOT smoke tobacco or vape for 24 hours before your procedure.  Please read over the following fact sheets that you were given. Anesthesia Post-op Instructions and Care and Recovery After Surgery      Upper Endoscopy, Adult, Care After After the procedure, it is common to have a sore throat. It is also common to have: Mild stomach pain or discomfort. Bloating. Nausea. Follow these instructions at home: The instructions below may help you care for yourself at home. Your health care provider may give you more instructions. If you have questions, ask your health care provider. If you were given a sedative during the procedure, it can affect you for several hours. Do not drive or operate machinery until your health care provider says that it is safe. If you will be going home right after the procedure, plan to have a responsible adult: Take you home from the hospital or clinic. You will not be allowed to drive. Care for you for the time you are told. Follow instructions from your health care provider about what you may eat and drink. Return to your normal activities as told by your health care provider. Ask your health care provider  what activities are safe for you. Take over-the-counter and prescription medicines only as told by your health care provider. Contact a health care provider if you: Have a sore throat that lasts longer than one day. Have trouble swallowing. Have a fever. Get help right away if you: Vomit blood or your vomit looks like coffee grounds. Have bloody, black, or tarry stools. Have a very bad sore throat or you cannot swallow. Have difficulty breathing or very bad pain in your chest or abdomen. These symptoms may be an emergency. Get help right away. Call 911. Do not wait to see if the symptoms will go away. Do not drive yourself to the hospital. Summary After the procedure, it is common to have a sore throat, mild stomach  discomfort, bloating, and nausea. If you were given a sedative during the procedure, it can affect you for several hours. Do not drive until your health care provider says that it is safe. Follow instructions from your health care provider about what you may eat and drink. Return to your normal activities as told by your health care provider. This information is not intended to replace advice given to you by your health care provider. Make sure you discuss any questions you have with your health care provider. Document Revised: 11/16/2021 Document Reviewed: 11/16/2021 Elsevier Patient Education  2024 [employer]General Anesthesia, Adult, Care After The following information offers guidance on how to care for yourself after your procedure. Your health care provider may also give you more specific instructions. If you have problems or questions, contact your health care provider. What can I expect after the procedure? After the procedure, it is common for people to: Have pain or discomfort at the IV site. Have nausea or vomiting. Have a sore throat or hoarseness. Have trouble concentrating. Feel cold or chills. Feel weak, sleepy, or tired (fatigue). Have soreness and body aches. These can affect parts of the body that were not involved in surgery. Follow these instructions at home: For the time period you were told by your health care provider:  Rest. Do not participate in activities where you could fall or become injured. Do not drive or use machinery. Do not drink alcohol. Do not take sleeping pills or medicines that cause drowsiness. Do not make important decisions or sign legal documents. Do not take care of children on your own. General instructions Drink enough fluid to keep your urine pale yellow. If you have sleep apnea, surgery and certain medicines can increase your risk for breathing problems. Follow instructions from your health care provider about wearing your sleep  device: Anytime you are sleeping, including during daytime naps. While taking prescription pain medicines, sleeping medicines, or medicines that make you drowsy. Return to your normal activities as told by your health care provider. Ask your health care provider what activities are safe for you. Take over-the-counter and prescription medicines only as told by your health care provider. Do not use any products that contain nicotine or tobacco. These products include cigarettes, chewing tobacco, and vaping devices, such as e-cigarettes. These can delay incision healing after surgery. If you need help quitting, ask your health care provider. Contact a health care provider if: You have nausea or vomiting that does not get better with medicine. You vomit every time you eat or drink. You have pain that does not get better with medicine. You cannot urinate or have bloody urine. You develop a skin rash. You have a fever. Get help right away if: You have  trouble breathing. You have chest pain. You vomit blood. These symptoms may be an emergency. Get help right away. Call 911. Do not wait to see if the symptoms will go away. Do not drive yourself to the hospital. Summary After the procedure, it is common to have a sore throat, hoarseness, nausea, vomiting, or to feel weak, sleepy, or fatigue. For the time period you were told by your health care provider, do not drive or use machinery. Get help right away if you have difficulty breathing, have chest pain, or vomit blood. These symptoms may be an emergency. This information is not intended to replace advice given to you by your health care provider. Make sure you discuss any questions you have with your health care provider. Document Revised: 11/04/2021 Document Reviewed: 11/04/2021 Elsevier Patient Education  2024 [employer].

## 2024-03-07 ENCOUNTER — Encounter (HOSPITAL_COMMUNITY)
Admission: RE | Admit: 2024-03-07 | Discharge: 2024-03-07 | Disposition: A | Discharge status: Home / Self Care | Source: Ambulatory Visit | Attending: Internal Medicine | Admitting: Internal Medicine

## 2024-03-07 ENCOUNTER — Other Ambulatory Visit: Payer: Self-pay

## 2024-03-07 ENCOUNTER — Encounter (HOSPITAL_COMMUNITY): Payer: Self-pay

## 2024-03-07 VITALS — BP 101/62 | HR 87 | Resp 18 | Ht 60.0 in | Wt 85.5 lb

## 2024-03-07 DIAGNOSIS — Z01818 Encounter for other preprocedural examination: Secondary | ICD-10-CM | POA: Diagnosis present

## 2024-03-07 DIAGNOSIS — Z8639 Personal history of other endocrine, nutritional and metabolic disease: Secondary | ICD-10-CM | POA: Diagnosis not present

## 2024-03-07 DIAGNOSIS — I10 Essential (primary) hypertension: Secondary | ICD-10-CM | POA: Diagnosis not present

## 2024-03-07 LAB — BASIC METABOLIC PANEL WITH GFR
Anion gap: 12 (ref 5–15)
BUN: 14 mg/dL (ref 8–23)
CO2: 25 mmol/L (ref 22–32)
Calcium: 9.4 mg/dL (ref 8.9–10.3)
Chloride: 102 mmol/L (ref 98–111)
Creatinine, Ser: 0.79 mg/dL (ref 0.44–1.00)
GFR, Estimated: >60 mL/min (ref >60)
Glucose, Bld: 134 mg/dL — ABNORMAL HIGH (ref 70–99)
Potassium: 3.3 mmol/L — ABNORMAL LOW (ref 3.5–5.1)
Sodium: 139 mmol/L (ref 135–145)

## 2024-03-07 NOTE — Pre-Procedure Instructions (Signed)
 EKG shown to Dr Landry and no orders were given.

## 2024-03-09 ENCOUNTER — Ambulatory Visit: Payer: Self-pay | Admitting: Internal Medicine

## 2024-03-12 ENCOUNTER — Encounter (HOSPITAL_COMMUNITY)
Admission: RE | Disposition: A | Discharge status: Home / Self Care | Payer: Self-pay | Source: Home / Self Care | Attending: Internal Medicine

## 2024-03-12 ENCOUNTER — Encounter (HOSPITAL_COMMUNITY): Payer: Self-pay | Admitting: Internal Medicine

## 2024-03-12 ENCOUNTER — Ambulatory Visit (HOSPITAL_COMMUNITY): Admitting: Anesthesiology

## 2024-03-12 ENCOUNTER — Ambulatory Visit (HOSPITAL_COMMUNITY)
Admission: RE | Admit: 2024-03-12 | Discharge: 2024-03-12 | Disposition: A | Discharge status: Home / Self Care | Attending: Internal Medicine | Admitting: Internal Medicine

## 2024-03-12 DIAGNOSIS — Z8711 Personal history of peptic ulcer disease: Secondary | ICD-10-CM | POA: Diagnosis not present

## 2024-03-12 DIAGNOSIS — K52831 Collagenous colitis: Secondary | ICD-10-CM | POA: Diagnosis not present

## 2024-03-12 DIAGNOSIS — Z09 Encounter for follow-up examination after completed treatment for conditions other than malignant neoplasm: Secondary | ICD-10-CM | POA: Insufficient documentation

## 2024-03-12 DIAGNOSIS — Z79899 Other long term (current) drug therapy: Secondary | ICD-10-CM | POA: Diagnosis not present

## 2024-03-12 DIAGNOSIS — K2100 Gastro-esophageal reflux disease with esophagitis, without bleeding: Secondary | ICD-10-CM | POA: Insufficient documentation

## 2024-03-12 DIAGNOSIS — F17210 Nicotine dependence, cigarettes, uncomplicated: Secondary | ICD-10-CM | POA: Diagnosis not present

## 2024-03-12 DIAGNOSIS — I10 Essential (primary) hypertension: Secondary | ICD-10-CM | POA: Diagnosis not present

## 2024-03-12 DIAGNOSIS — K279 Peptic ulcer, site unspecified, unspecified as acute or chronic, without hemorrhage or perforation: Secondary | ICD-10-CM

## 2024-03-12 SURGERY — EGD (ESOPHAGOGASTRODUODENOSCOPY)
Anesthesia: General

## 2024-03-12 MED ORDER — STERILE WATER FOR IRRIGATION IR SOLN
Status: DC | PRN
  Administered 2024-03-12: 60 mL

## 2024-03-12 MED ORDER — LACTATED RINGERS IV SOLN: INTRAVENOUS | Status: DC

## 2024-03-12 MED ORDER — PROPOFOL 10 MG/ML IV BOLUS
INTRAVENOUS | Status: DC | PRN
Start: 2024-03-12 — End: 2024-03-12
  Administered 2024-03-12: 50 mg via INTRAVENOUS

## 2024-03-12 MED ORDER — LIDOCAINE 2% (20 MG/ML) 5 ML SYRINGE
INTRAMUSCULAR | Status: DC | PRN
  Administered 2024-03-12: 60 mg via INTRAVENOUS

## 2024-03-12 MED ORDER — PROPOFOL 500 MG/50ML IV EMUL
INTRAVENOUS | Status: DC | PRN
  Administered 2024-03-12: 150 ug/kg/min via INTRAVENOUS

## 2024-03-12 NOTE — Interval H&P Note (Signed)
 History and Physical Interval Note:  03/12/2024 2:09 PM  Joan Collins  has presented today for surgery, with the diagnosis of peptic ulcer.  The various methods of treatment have been discussed with the patient and family. After consideration of risks, benefits and other options for treatment, the patient has consented to  Procedure(s) with comments: EGD (ESOPHAGOGASTRODUODENOSCOPY) (N/A) - 2:30 pm, asa 3 as a surgical intervention.  The patient's history has been reviewed, patient examined, no change in status, stable for surgery.  I have reviewed the patient's chart and labs.  Questions were answered to the patient's satisfaction.     Shirlena Brinegar    No more dysphagia.  Bowels normal -   On Entocort.  No NSAIDs.  EGD today to verify ulcer healing.  The risks, benefits, limitations, alternatives and imponderables have been reviewed with the patient. Potential for esophageal dilation, biopsy, etc. have also been reviewed.  Questions have been answered. All parties agreeable.

## 2024-03-12 NOTE — Interval H&P Note (Signed)
 History and Physical Interval Note:  03/12/2024 2:04 PM  Joan  G Collins  has presented today for surgery, with the diagnosis of peptic ulcer.  The various methods of treatment have been discussed with the patient and family. After consideration of risks, benefits and other options for treatment, the patient has consented to  Procedure(s) with comments: EGD (ESOPHAGOGASTRODUODENOSCOPY) (N/A) - 2:30 pm, asa 3 as a surgical intervention.  The patient's history has been reviewed, patient examined, no change in status, stable for surgery.  I have reviewed the patient's chart and labs.  Questions were answered to the patient's satisfaction.     Joan Collins     no change.  Surveillance EGD to verify ulcer healing per plan.  The risks, benefits, limitations, alternatives and imponderables have been reviewed with the patient. Potential for esophageal dilation, biopsy, etc. have also been reviewed.  Questions have been answered. All parties agreeable.

## 2024-03-12 NOTE — Progress Notes (Signed)
 EKG was sent to the PCP

## 2024-03-12 NOTE — Anesthesia Preprocedure Evaluation (Signed)
 Anesthesia Evaluation  Patient identified by MRN, date of birth, ID band Patient awake    Reviewed: Allergy & Precautions, H&P , NPO status , Patient's Chart, lab work & pertinent test results, reviewed documented beta blocker date and time   History of Anesthesia Complications (+) PONV and history of anesthetic complications  Airway Mallampati: II  TM Distance: >3 FB Neck ROM: full    Dental no notable dental hx.    Pulmonary neg pulmonary ROS, Current Smoker   Pulmonary exam normal breath sounds clear to auscultation       Cardiovascular Exercise Tolerance: Good hypertension, negative cardio ROS  Rhythm:regular Rate:Normal     Neuro/Psych  Headaches negative neurological ROS  negative psych ROS   GI/Hepatic negative GI ROS, Neg liver ROS, PUD,GERD  ,,  Endo/Other  negative endocrine ROS    Renal/GU negative Renal ROS  negative genitourinary   Musculoskeletal   Abdominal   Peds  Hematology negative hematology ROS (+)   Anesthesia Other Findings   Reproductive/Obstetrics negative OB ROS                             Anesthesia Physical Anesthesia Plan  ASA: 2  Anesthesia Plan: General   Post-op Pain Management:    Induction:   PONV Risk Score and Plan: Propofol  infusion  Airway Management Planned:   Additional Equipment:   Intra-op Plan:   Post-operative Plan:   Informed Consent: I have reviewed the patients History and Physical, chart, labs and discussed the procedure including the risks, benefits and alternatives for the proposed anesthesia with the patient or authorized representative who has indicated his/her understanding and acceptance.     Dental Advisory Given  Plan Discussed with: CRNA  Anesthesia Plan Comments:        Anesthesia Quick Evaluation

## 2024-03-12 NOTE — Discharge Instructions (Addendum)
 EGD Discharge instructions Please read the instructions outlined below and refer to this sheet in the next few weeks. These discharge instructions provide you with general information on caring for yourself after you leave the hospital. Your doctor may also give you specific instructions. While your treatment has been planned according to the most current medical practices available, unavoidable complications occasionally occur. If you have any problems or questions after discharge, please call your doctor. ACTIVITY You may resume your regular activity but move at a slower pace for the next 24 hours.  Take frequent rest periods for the next 24 hours.  Walking will help expel (get rid of) the air and reduce the bloated feeling in your abdomen.  No driving for 24 hours (because of the anesthesia (medicine) used during the test).  You may shower.  Do not sign any important legal documents or operate any machinery for 24 hours (because of the anesthesia used during the test).  NUTRITION Drink plenty of fluids.  You may resume your normal diet.  Begin with a light meal and progress to your normal diet.  Avoid alcoholic beverages for 24 hours or as instructed by your caregiver.  MEDICATIONS You may resume your normal medications unless your caregiver tells you otherwise.  WHAT YOU CAN EXPECT TODAY You may experience abdominal discomfort such as a feeling of fullness or "gas" pains.  FOLLOW-UP Your doctor will discuss the results of your test with you.  SEEK IMMEDIATE MEDICAL ATTENTION IF ANY OF THE FOLLOWING OCCUR: Excessive nausea (feeling sick to your stomach) and/or vomiting.  Severe abdominal pain and distention (swelling).  Trouble swallowing.  Temperature over 101 F (37.8 C).  Rectal bleeding or vomiting of blood.     your ulcer has healed.  You still have some acid reflux irritation in your esophagus.  Please continue to avoid all forms of aspirin/NSAIDs like ibuprofen,  etc.  Continue pantoprazole  40 mg twice daily  Continue Entocort daily  Office visit with us  in 2 months  At patient request, I called [facility] at [phone] findings and recommendations

## 2024-03-12 NOTE — Op Note (Signed)
 [facility] Patient Name: Joan  Collins Procedure Date: 03/12/2024 1:50 PM MRN: [id number] Date of Birth: Dec 23, 1955 Attending MD: Lamar Ozell Hollingshead , MD, [id number] CSN: [id number] Age: 68 Admit Type: Outpatient Procedure:                Upper GI endoscopy Indications:              Peptic ulcer Providers:                Lamar Ozell Hollingshead, MD, Crystal Page, [place]                            Wilson, Technician, Bascom Blush Referring MD:              Medicines:                Propofol  per Anesthesia Complications:            No immediate complications. Estimated Blood Loss:     Estimated blood loss: none. Procedure:                Pre-Anesthesia Assessment:                           - Prior to the procedure, a History and Physical                            was performed, and patient medications and                            allergies were reviewed. The patient's tolerance of                            previous anesthesia was also reviewed. The risks                            and benefits of the procedure and the sedation                            options and risks were discussed with the patient.                            All questions were answered, and informed consent                            was obtained. Prior Anticoagulants: The patient has                            taken no anticoagulant or antiplatelet agents. ASA                            Grade Assessment: III - A patient with severe                            systemic disease. After reviewing the risks and  benefits, the patient was deemed in satisfactory                            condition to undergo the procedure.                           After obtaining informed consent, the endoscope was                            passed under direct vision. Throughout the                            procedure, the patient's blood pressure, pulse, and                            oxygen  saturations were monitored continuously. The                            [id number]-H190 ([id number]) scope was introduced through the                            mouth, and advanced to the second part of duodenum.                            The upper GI endoscopy was accomplished without                            difficulty. The patient tolerated the procedure                            well. Scope In: 2:18:13 PM Scope Out: 2:23:17 PM Total Procedure Duration: 0 hours 5 minutes 4 seconds  Findings:      (1) 2 cm linear erosions at the GE junction. No Barrett's epithelium       seen. Previously noted pyloric channel ulcer completely healed some scar       present. Otherwise, gastric mucosa appeared normal.      The duodenal bulb and second portion of the duodenum were normal. Impression:               - Erosive reflux esophagitis. Previously noted                            gastric ulcer completely healed. Normal duodenal                            bulb and second portion of the duodenum.                           - No specimens collected. Moderate Sedation:      Moderate (conscious) sedation was personally administered by an       anesthesia professional. The following parameters were monitored: oxygen       saturation, heart rate, blood pressure, respiratory rate, EKG, adequacy       of pulmonary ventilation, and response to care. Recommendation:           -  Patient has a contact number available for                            emergencies. The signs and symptoms of potential                            delayed complications were discussed with the                            patient. Return to normal activities tomorrow.                            Written discharge instructions were provided to the                            patient.                           - Advance diet as tolerated. Continue twice daily                            PPI. Continue budesonide . Continue to avoid NSAIDs.                             Office visit with us  in 2 months. Procedure Code(s):        --- Professional ---                           [phone], Esophagogastroduodenoscopy, flexible,                            transoral; diagnostic, including collection of                            specimen(s) by brushing or washing, when performed                            (separate procedure) Diagnosis Code(s):        --- Professional ---                           K27.9, Peptic ulcer, site unspecified, unspecified                            as acute or chronic, without hemorrhage or                            perforation CPT copyright 2022 American Medical Association. All rights reserved. The codes documented in this report are preliminary and upon coder review may  be revised to meet current compliance requirements. Lamar HERO. Aalani Aikens, MD Lamar Ozell Hollingshead, MD 03/12/2024 2:39:10 PM This report has been signed electronically. Number of Addenda: 0

## 2024-03-12 NOTE — Transfer of Care (Signed)
 Immediate Anesthesia Transfer of Care Note  Patient: Joan Collins  Procedure(s) Performed: EGD (ESOPHAGOGASTRODUODENOSCOPY)  Patient Location: Short Stay  Anesthesia Type:General  Level of Consciousness: awake, alert , oriented, and patient cooperative  Airway & Oxygen Therapy: Patient Spontanous Breathing  Post-op Assessment: Report given to RN, Post -op Vital signs reviewed and stable, and Patient moving all extremities X 4  Post vital signs: Reviewed and stable  Last Vitals:  Vitals Value Taken Time  BP 106/60 03/12/24 14:28  Temp 36.5 C 03/12/24 14:28  Pulse 63 03/12/24 14:28  Resp 19 03/12/24 14:28  SpO2 98 % 03/12/24 14:28    Last Pain:  Vitals:   03/12/24 1428  TempSrc: Oral  PainSc: 0-No pain         Complications: No notable events documented.

## 2024-03-13 ENCOUNTER — Encounter (HOSPITAL_COMMUNITY): Payer: Self-pay | Admitting: Internal Medicine

## 2024-03-15 NOTE — Anesthesia Postprocedure Evaluation (Signed)
 Anesthesia Post Note  Patient: Joan Collins  Procedure(s) Performed: EGD (ESOPHAGOGASTRODUODENOSCOPY)  Patient location during evaluation: Phase II Anesthesia Type: General Level of consciousness: awake Pain management: pain level controlled Vital Signs Assessment: post-procedure vital signs reviewed and stable Respiratory status: spontaneous breathing and respiratory function stable Cardiovascular status: blood pressure returned to baseline and stable Postop Assessment: no headache and no apparent nausea or vomiting Anesthetic complications: no Comments: Late entry   No notable events documented.   Last Vitals:  Vitals:   03/12/24 1256 03/12/24 1428  BP: 128/72 106/60  Pulse:  63  Resp: 16 19  Temp: 36.7 C 36.5 C  SpO2: 99% 98%    Last Pain:  Vitals:   03/12/24 1428  TempSrc: Oral  PainSc: 0-No pain                 Yvonna JINNY Bosworth

## 2024-04-18 ENCOUNTER — Encounter: Payer: Self-pay | Admitting: Internal Medicine

## 2024-04-30 ENCOUNTER — Encounter: Payer: Self-pay | Admitting: Family Medicine

## 2024-05-04 ENCOUNTER — Other Ambulatory Visit: Payer: Self-pay | Admitting: Internal Medicine

## 2024-06-11 ENCOUNTER — Encounter: Payer: Self-pay | Admitting: Internal Medicine

## 2024-06-23 ENCOUNTER — Ambulatory Visit: Admitting: Internal Medicine

## 2024-06-23 ENCOUNTER — Telehealth: Payer: Self-pay | Admitting: Internal Medicine

## 2024-06-23 ENCOUNTER — Encounter: Payer: Self-pay | Admitting: Internal Medicine

## 2024-06-23 VITALS — BP 108/74 | HR 94 | Temp 98.1°F | Ht 61.0 in | Wt 85.0 lb

## 2024-06-23 DIAGNOSIS — K279 Peptic ulcer, site unspecified, unspecified as acute or chronic, without hemorrhage or perforation: Secondary | ICD-10-CM

## 2024-06-23 DIAGNOSIS — K52831 Collagenous colitis: Secondary | ICD-10-CM | POA: Diagnosis not present

## 2024-06-23 DIAGNOSIS — R1319 Other dysphagia: Secondary | ICD-10-CM

## 2024-06-23 DIAGNOSIS — K219 Gastro-esophageal reflux disease without esophagitis: Secondary | ICD-10-CM

## 2024-06-23 NOTE — Telephone Encounter (Signed)
 Patient was seen in the office today and stated that she would be switching PCPs.  She will now be going to Lauraine Lunger, FNP and asked that all of her records from here be sent to her.  Sent 06/23/24

## 2024-06-23 NOTE — Progress Notes (Unsigned)
 Gastroenterology Progress Note    Primary Care Physician:  Malachy Burnard Helling, FNP Primary Gastroenterologist:  Dr. Shaaron  Pre-Procedure History & Physical: HPI:  Joan  DENELL Collins is a 68 y.o. female here for follow-up Rose reflux esophagitis peptic ulcer disease NSAID induced.  No H. pylori follow-up EGD demonstrated complete healing of peptic ulcer disease.  Doing very well on twice daily Protonix .  May be able to drop back to once daily.  No dysphagia.  No EOE.  Biopsies of her colon demonstrated collagenous colitis.  She took 3 months of budesonide  9 mg daily and then stopped.  She states she is doing very well, stopping the budesonide  about 2 months ago.  Stool consistency and frequency has reverted back to normal.  Of note, this lady weighs about 85 pounds she has weighed this much for some time she states that she weighed about 130 when she had her children.  She states she just does not like to eat she is a picky eater when she wants to eat she can eat for instance, yesterday she went to her church homecoming and ate playful and a half of all the good food.  Had fried pork chops at her daughter's house last night and ate everything.  Past Medical History:  Diagnosis Date   Arthritis    GERD (gastroesophageal reflux disease)    Headache(784.0)    Migraines   Hypertension    PONV (postoperative nausea and vomiting)     Past Surgical History:  Procedure Laterality Date   BIOPSY  09/15/2020   Procedure: BIOPSY;  Surgeon: Shaaron Lamar HERO, MD;  Location: [facility];  Service: Endoscopy;;   CARPAL TUNNEL RELEASE Right    CATARACT EXTRACTION Bilateral    COLONOSCOPY N/A 05/04/2014   Dr. Shaaron: Diverticulosis, single polyp removed, no adenomatous changes.  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  Nonbleeding gastric ulcers likely NSAID related/celebrex/excedrin (biopsy with ulcer with reactive changes, no malignancy, no H. pylori).   ESOPHAGOGASTRODUODENOSCOPY N/A 04/20/2021   Procedure: ESOPHAGOGASTRODUODENOSCOPY (EGD);  Surgeon: Shaaron Lamar HERO, MD;  Location: [facility];  Service: Endoscopy;  Laterality: N/A;  11:15am   ESOPHAGOGASTRODUODENOSCOPY N/A 03/12/2024   Procedure: EGD (ESOPHAGOGASTRODUODENOSCOPY);  Surgeon: Shaaron Lamar HERO, MD;  Location: [facility];  Service: Endoscopy;  Laterality: N/A;  2:30 pm, asa 3   ESOPHAGOGASTRODUODENOSCOPY (EGD) WITH PROPOFOL  N/A 12/12/2023   Procedure: ESOPHAGOGASTRODUODENOSCOPY (EGD) WITH PROPOFOL ;  Surgeon: Shaaron Lamar HERO, MD;  Location: [facility];  Service: Endoscopy;  Laterality: N/A;   MALONEY DILATION N/A 12/12/2023   Procedure: DILATION, ESOPHAGUS, USING MALONEY DILATOR;  Surgeon: Shaaron Lamar HERO, MD;  Location: [facility];  Service: Endoscopy;  Laterality: N/A;   TUBAL LIGATION      Prior to Admission medications   Medication Sig Start Date End Date Taking? Authorizing Provider  atorvastatin (LIPITOR) 40 MG tablet Take 40 mg by mouth daily. 06/19/24  Yes [provider]  budesonide  (ENTOCORT EC ) 3 MG 24 hr capsule TAKE THREE CAPSULES BY MOUTH ONCE DAILY 05/06/24  Yes Makeshia Seat, Lamar HERO, MD  mirtazapine (REMERON) 30 MG tablet Take 30 mg by mouth at bedtime. 09/24/23  Yes [provider]  Multiple Vitamin (MULTIVITAMIN) tablet Take 1 tablet by mouth daily.   Yes [provider]  pantoprazole  (PROTONIX ) 40 MG tablet Take 1 tablet (40 mg total) by mouth 2 (two) times daily before a meal. 09/12/21  Yes Ezzard Sonny RAMAN, PA-C  potassium chloride  SA (KLOR-CON  M) 20 MEQ tablet  Take 2 tablets (40 mEq total) by mouth 2 (two) times daily. Pt is to take 40 mEq once today (12/10/23), 40 mEq bid tomorrow(12/11/2023) and 40 mEq the morning of procedure with sips of water  (12/12/2023). 12/10/23  Yes Precious Gilchrest, Lamar HERO, MD  topiramate (TOPAMAX) 100 MG tablet Take 100 mg by mouth 2 (two) times daily. 01/21/24  Yes [provider]  vitamin B-12 (CYANOCOBALAMIN) 1000 MCG tablet Take 1,000 mcg by mouth in the morning.   Yes [provider]    Allergies as of 06/23/2024   (No Known Allergies)    Family History  Problem Relation Age of Onset   Colon cancer Neg Hx     Social History   Socioeconomic History   Marital status: Widowed    Spouse name: Not on file   Number of children: Not on file   Years of education: Not on file   Highest education level: Not on file  Occupational History   Not on file  Tobacco Use   Smoking status: Every Day    Current packs/day: 0.50    Average packs/day: 0.5 packs/day for 20.0 years (10.0 ttl pk-yrs)    Types: Cigarettes   Smokeless tobacco: Never  Vaping Use   Vaping status: Never Used  Substance and Sexual Activity   Alcohol use: No   Drug use: No   Sexual activity: Not on file  Other Topics Concern   Not on file  Social History Narrative   Not on file   Social Drivers of Health   Financial Resource Strain: Not on file  Food Insecurity: Not on file  Transportation Needs: Not on file  Physical Activity: Not on file  Stress: Not on file  Social Connections: Not on file  Intimate Partner Violence: Not on file    Review of Systems   See HPI, otherwise negative ROS  Physical Exam: BP 108/74 (BP Location: Left Arm, Patient Position: Sitting, Cuff Size: Normal)   Pulse 94   Temp 98.1 F (36.7 C) (Oral)   Ht 5' 1 (1.549 m)   Wt 85 lb (38.6 kg)   SpO2 95%   BMI 16.06 kg/m  General:   Alert,  Well-developed, well-nourished, pleasant and cooperative in NAD Neck:  Supple; no masses or thyromegaly. No  significant cervical adenopathy. Lungs:  Clear throughout to auscultation.   No wheezes, crackles, or rhonchi. No acute distress. Heart:  Regular rate and rhythm; no murmurs, clicks, rubs,  or gallops. Abdomen: Non-distended, normal bowel sounds.  Soft and nontender without appreciable mass or hepatosplenomegaly.   Impression/Plan:   68 year old lady with a history of NSAID induced peptic ulcer disease erosive reflux esophagitis and collagenous colitis.  She is doing well on twice daily PPI therapy.  She took a full 3 months of budesonide  9 mg daily and then abruptly took no more as she did not come in for further instructions.  However, she is doing great she feels well no diarrhea reflux well-controlled no dysphagia.  Future colonoscopy not recommended unless new symptoms develop.  Also, repeat EGD not warranted  unless new symptoms develop  She is a long-term smoker.  We discussed the fact that collagenous colitis is not cured she is now likely in pretty good remission.  She understands that colitis can recur with associated diarrhea.  Recommendations:   I recommend you try backing off on the pantoprazole  Protonix  to 40 mg once daily before breakfast.  If this does not control your symptoms you may go back to twice a day  Your colitis is in remission.  It is not cured but hopefully it will be in active moving forward.  If you develop recurrent diarrhea, please let me know and we will get you back on budesonide   I do recommend you stop smoking  Unless something comes up, plan to see you back in the office in 1 year.  Please avoid all aspirin powder/NSAIDs in the future.  These medications will put you at risk for recurrent peptic ulcer disease (and colitis).  Notice: This dictation was prepared with Dragon dictation along with smaller phrase technology. Any transcriptional errors that result from this process are unintentional and may not be corrected upon review.

## 2024-06-23 NOTE — Patient Instructions (Addendum)
 It was nice to see you again today!  I recommend you try backing off on the pantoprazole  Protonix  to 40 mg once daily before breakfast.  If this does not control your symptoms you may go back to twice a day  Your colitis is in remission.  It is not cured but hopefully it will be in active moving forward.  If you develop recurrent diarrhea, please let me know and we will get you back on budesonide   I do recommend you stop smoking  Unless something comes up, plan to see you back in the office in 1 year.  Please avoid all aspirin powder/NSAIDs in the future.  These medications will put you at risk for recurrent peptic ulcer disease (and colitis).

## 2024-07-02 ENCOUNTER — Other Ambulatory Visit (HOSPITAL_COMMUNITY): Payer: Self-pay

## 2024-07-02 DIAGNOSIS — Z720 Tobacco use: Secondary | ICD-10-CM

## 2024-09-12 ENCOUNTER — Ambulatory Visit: Admitting: Internal Medicine

## 2024-09-17 ENCOUNTER — Ambulatory Visit: Admitting: Internal Medicine

## 2024-09-30 ENCOUNTER — Ambulatory Visit: Admitting: Internal Medicine
